# Patient Record
Sex: Female | Born: 1960 | Race: White | Hispanic: No | Marital: Married | Smoking: Never smoker
Health system: Southern US, Community
[De-identification: ages and names within clinical notes are randomized; demographics above are authoritative.]

## PROBLEM LIST (undated history)

## (undated) DIAGNOSIS — Z973 Presence of spectacles and contact lenses: Secondary | ICD-10-CM

## (undated) DIAGNOSIS — I10 Essential (primary) hypertension: Secondary | ICD-10-CM

## (undated) DIAGNOSIS — E785 Hyperlipidemia, unspecified: Secondary | ICD-10-CM

## (undated) DIAGNOSIS — G43909 Migraine, unspecified, not intractable, without status migrainosus: Secondary | ICD-10-CM

## (undated) DIAGNOSIS — Z9889 Other specified postprocedural states: Secondary | ICD-10-CM

## (undated) DIAGNOSIS — K219 Gastro-esophageal reflux disease without esophagitis: Secondary | ICD-10-CM

## (undated) DIAGNOSIS — Z8489 Family history of other specified conditions: Secondary | ICD-10-CM

## (undated) DIAGNOSIS — R112 Nausea with vomiting, unspecified: Secondary | ICD-10-CM

## (undated) DIAGNOSIS — R3915 Urgency of urination: Secondary | ICD-10-CM

## (undated) DIAGNOSIS — N200 Calculus of kidney: Secondary | ICD-10-CM

## (undated) DIAGNOSIS — I444 Left anterior fascicular block: Secondary | ICD-10-CM

## (undated) DIAGNOSIS — T83122A Displacement of urinary stent, initial encounter: Secondary | ICD-10-CM

## (undated) DIAGNOSIS — Z86018 Personal history of other benign neoplasm: Secondary | ICD-10-CM

## (undated) DIAGNOSIS — Z87442 Personal history of urinary calculi: Secondary | ICD-10-CM

## (undated) DIAGNOSIS — K449 Diaphragmatic hernia without obstruction or gangrene: Secondary | ICD-10-CM

## (undated) DIAGNOSIS — G629 Polyneuropathy, unspecified: Secondary | ICD-10-CM

## (undated) HISTORY — PX: TONSILLECTOMY AND ADENOIDECTOMY: SUR1326

---

## 1982-11-15 HISTORY — PX: TOTAL ABDOMINAL HYSTERECTOMY W/ BILATERAL SALPINGOOPHORECTOMY: SHX83

## 1984-11-15 HISTORY — PX: PARTIAL NEPHRECTOMY: SHX414

## 1990-11-15 HISTORY — PX: REDUCTION MAMMAPLASTY: SUR839

## 1999-03-26 ENCOUNTER — Other Ambulatory Visit: Admission: RE | Admit: 1999-03-26 | Discharge: 1999-03-26 | Payer: Self-pay | Admitting: Obstetrics and Gynecology

## 1999-07-29 ENCOUNTER — Encounter: Payer: Self-pay | Admitting: Urology

## 1999-07-29 ENCOUNTER — Ambulatory Visit (HOSPITAL_COMMUNITY): Admission: AD | Admit: 1999-07-29 | Discharge: 1999-07-29 | Payer: Self-pay | Admitting: Urology

## 2000-06-08 ENCOUNTER — Emergency Department (HOSPITAL_COMMUNITY): Admission: EM | Admit: 2000-06-08 | Discharge: 2000-06-08 | Payer: Self-pay | Admitting: Internal Medicine

## 2003-10-23 ENCOUNTER — Ambulatory Visit (HOSPITAL_COMMUNITY): Admission: RE | Admit: 2003-10-23 | Discharge: 2003-10-23 | Payer: Self-pay | Admitting: Obstetrics and Gynecology

## 2006-01-20 ENCOUNTER — Ambulatory Visit: Payer: Self-pay | Admitting: Internal Medicine

## 2006-03-09 ENCOUNTER — Ambulatory Visit: Payer: Self-pay | Admitting: Neurology

## 2006-05-12 ENCOUNTER — Encounter: Admission: RE | Admit: 2006-05-12 | Discharge: 2006-05-12 | Payer: Self-pay | Admitting: Neurosurgery

## 2006-05-31 ENCOUNTER — Encounter: Admission: RE | Admit: 2006-05-31 | Discharge: 2006-05-31 | Payer: Self-pay | Admitting: Neurosurgery

## 2007-04-03 HISTORY — PX: OTHER SURGICAL HISTORY: SHX169

## 2007-04-04 ENCOUNTER — Inpatient Hospital Stay (HOSPITAL_COMMUNITY): Admission: RE | Admit: 2007-04-04 | Discharge: 2007-04-05 | Payer: Self-pay | Admitting: Obstetrics and Gynecology

## 2007-07-28 ENCOUNTER — Emergency Department (HOSPITAL_COMMUNITY): Admission: EM | Admit: 2007-07-28 | Discharge: 2007-07-28 | Payer: Self-pay | Admitting: Emergency Medicine

## 2008-02-04 ENCOUNTER — Encounter: Admission: RE | Admit: 2008-02-04 | Discharge: 2008-02-04 | Payer: Self-pay | Admitting: Neurosurgery

## 2008-03-19 ENCOUNTER — Encounter: Admission: RE | Admit: 2008-03-19 | Discharge: 2008-03-19 | Payer: Self-pay | Admitting: Neurosurgery

## 2008-04-02 ENCOUNTER — Emergency Department (HOSPITAL_COMMUNITY): Admission: EM | Admit: 2008-04-02 | Discharge: 2008-04-02 | Payer: Self-pay | Admitting: Emergency Medicine

## 2008-05-13 ENCOUNTER — Inpatient Hospital Stay (HOSPITAL_COMMUNITY): Admission: RE | Admit: 2008-05-13 | Discharge: 2008-05-15 | Payer: Self-pay | Admitting: Neurosurgery

## 2008-05-13 HISTORY — PX: ANTERIOR CERVICAL DECOMP/DISCECTOMY FUSION: SHX1161

## 2008-05-18 ENCOUNTER — Emergency Department (HOSPITAL_COMMUNITY): Admission: EM | Admit: 2008-05-18 | Discharge: 2008-05-18 | Payer: Self-pay | Admitting: Emergency Medicine

## 2009-03-12 ENCOUNTER — Ambulatory Visit (HOSPITAL_COMMUNITY): Admission: RE | Admit: 2009-03-12 | Discharge: 2009-03-12 | Payer: Self-pay | Admitting: Family Medicine

## 2009-03-24 ENCOUNTER — Encounter: Admission: RE | Admit: 2009-03-24 | Discharge: 2009-03-24 | Payer: Self-pay | Admitting: Surgery

## 2009-04-16 ENCOUNTER — Encounter: Admission: RE | Admit: 2009-04-16 | Discharge: 2009-04-16 | Payer: Self-pay | Admitting: Neurosurgery

## 2009-05-06 ENCOUNTER — Ambulatory Visit (HOSPITAL_COMMUNITY): Admission: RE | Admit: 2009-05-06 | Discharge: 2009-05-06 | Payer: Self-pay | Admitting: Neurosurgery

## 2009-09-19 ENCOUNTER — Encounter: Admission: RE | Admit: 2009-09-19 | Discharge: 2009-09-19 | Payer: Self-pay | Admitting: Neurosurgery

## 2009-10-24 ENCOUNTER — Inpatient Hospital Stay (HOSPITAL_COMMUNITY): Admission: RE | Admit: 2009-10-24 | Discharge: 2009-10-25 | Payer: Self-pay | Admitting: Neurosurgery

## 2009-10-24 HISTORY — PX: POSTERIOR FUSION CERVICAL SPINE: SUR628

## 2009-10-27 ENCOUNTER — Inpatient Hospital Stay (HOSPITAL_COMMUNITY): Admission: AD | Admit: 2009-10-27 | Discharge: 2009-10-29 | Payer: Self-pay | Admitting: Neurosurgery

## 2009-11-03 ENCOUNTER — Inpatient Hospital Stay (HOSPITAL_COMMUNITY): Admission: EM | Admit: 2009-11-03 | Discharge: 2009-11-05 | Payer: Self-pay | Admitting: Emergency Medicine

## 2010-01-01 ENCOUNTER — Encounter: Admission: RE | Admit: 2010-01-01 | Discharge: 2010-01-01 | Payer: Self-pay | Admitting: Neurosurgery

## 2010-02-13 IMAGING — US US ABDOMEN COMPLETE
1 series · 14 of 25 positions shown · non-contrast
Comparison: CT abdomen pelvis 03/12/2009 and ultrasound abdomen
07/28/2007

CLINICAL DATA: Right upper quadrant pain.

COMPLETE ABDOMINAL ULTRASOUND

[Series 1: us abdomen complete · 0.37mm/px · 14 of 70 slices shown]
[im 1/70]
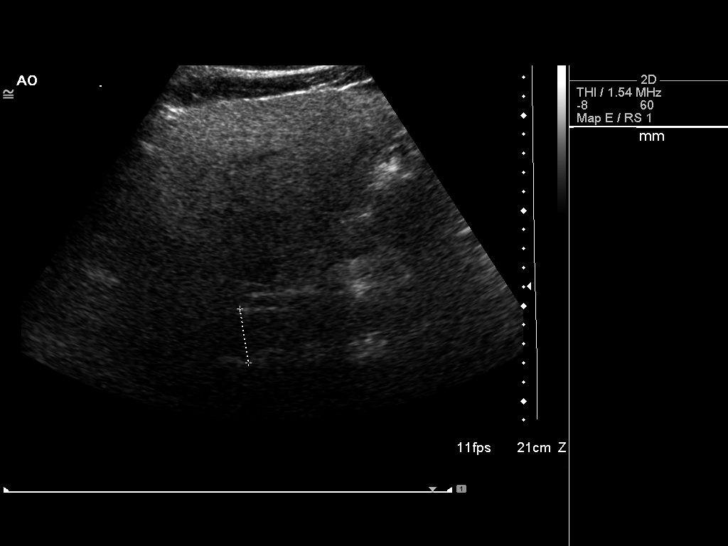
[im 6/70]
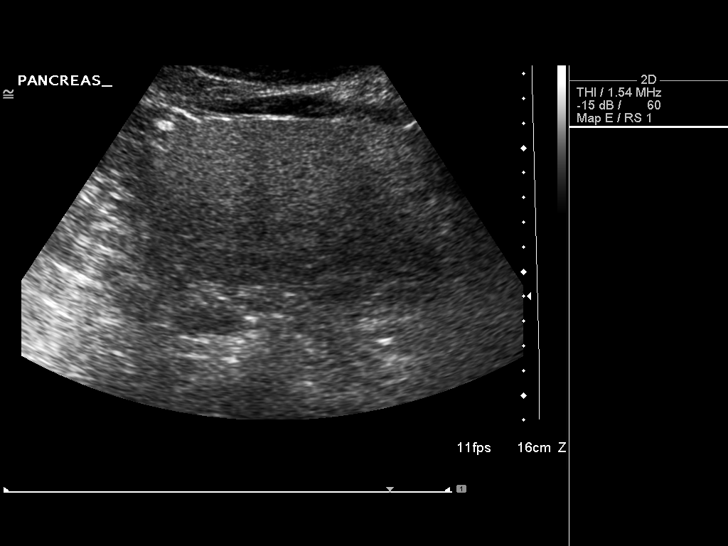
[im 12/70]
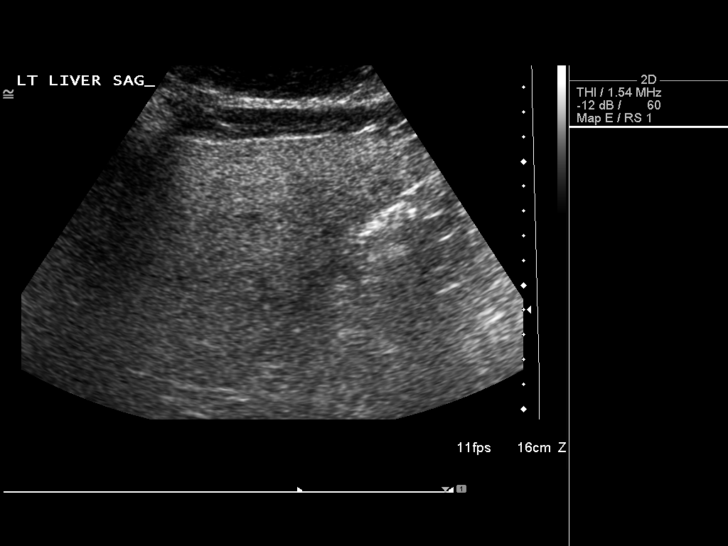
[im 18/70]
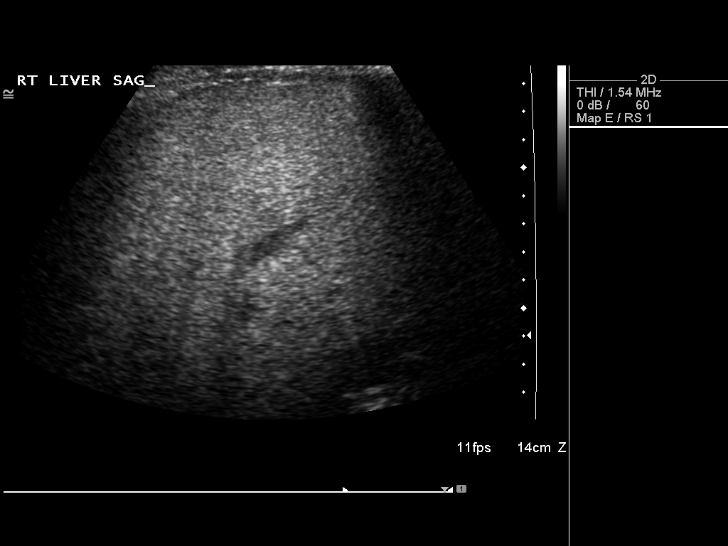
[im 24/70]
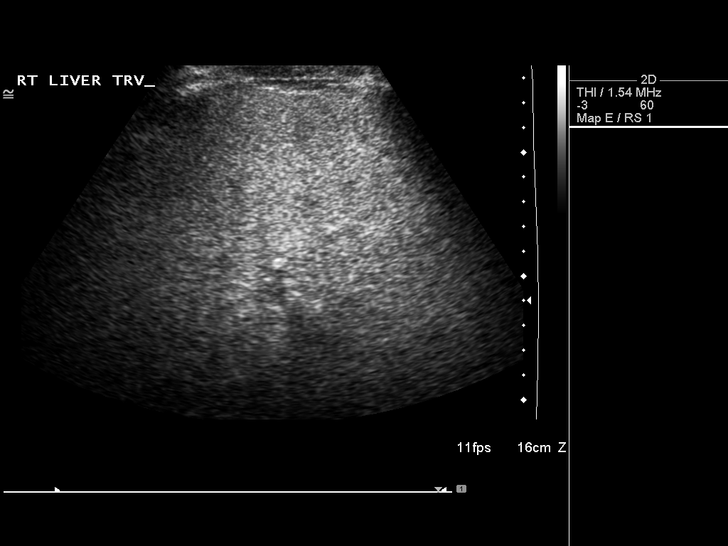
[im 26/70]
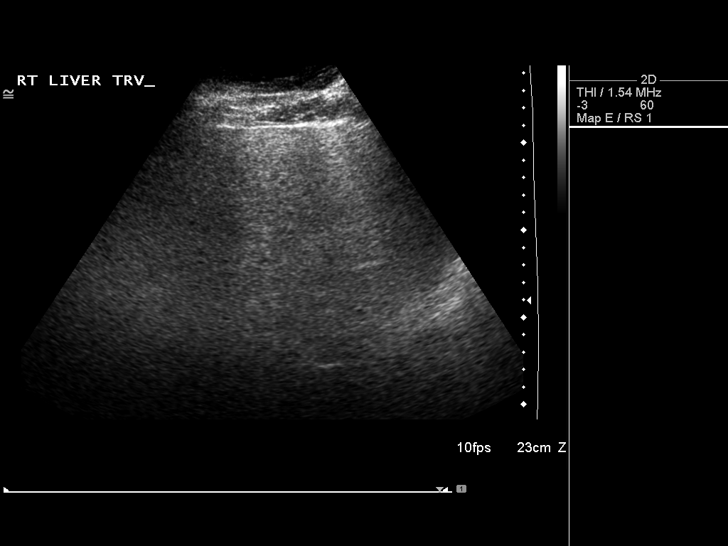
[im 32/70]
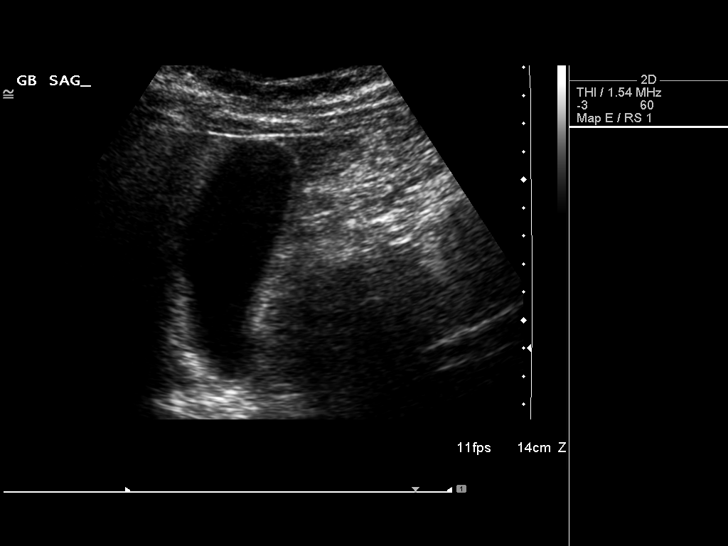
[im 38/70]
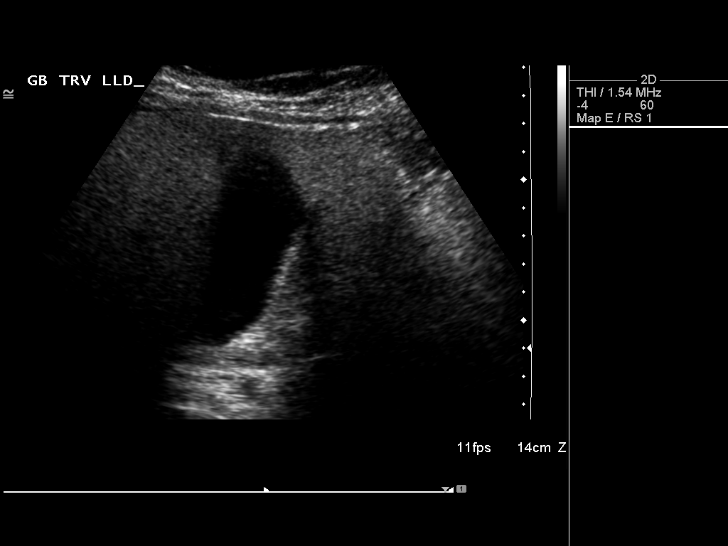
[im 44/70]
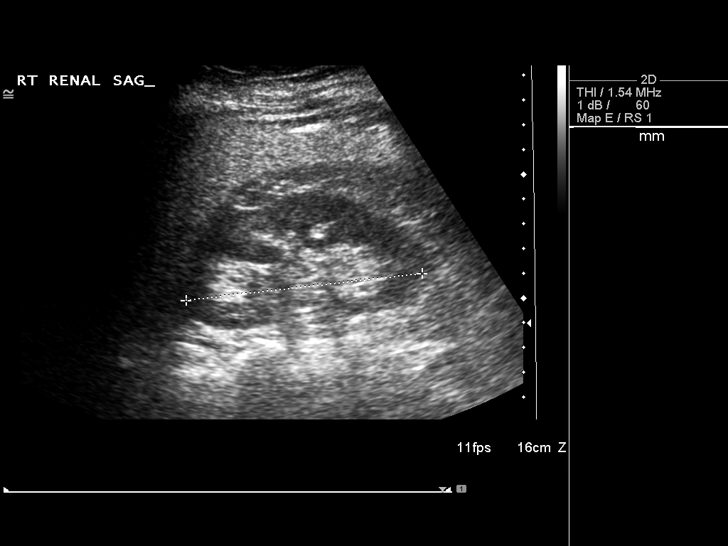
[im 47/70]
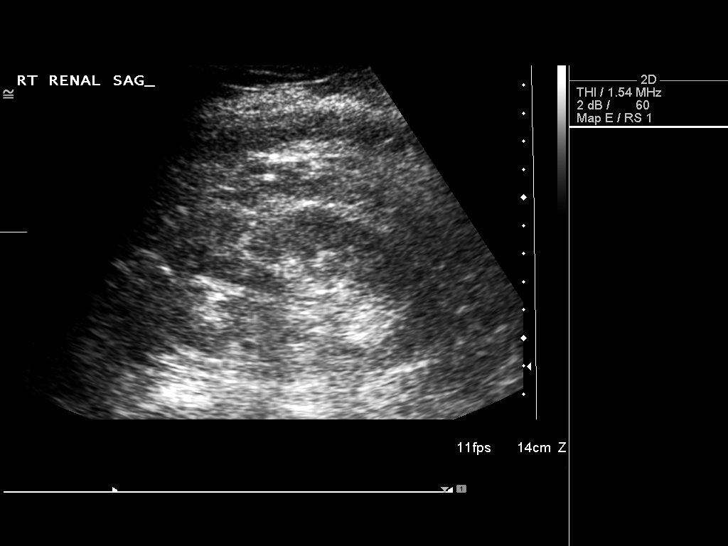
[im 52/70]
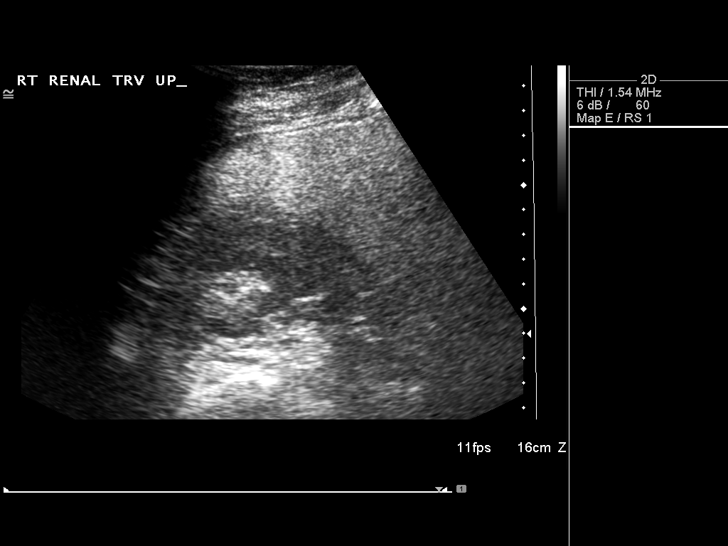
[im 58/70]
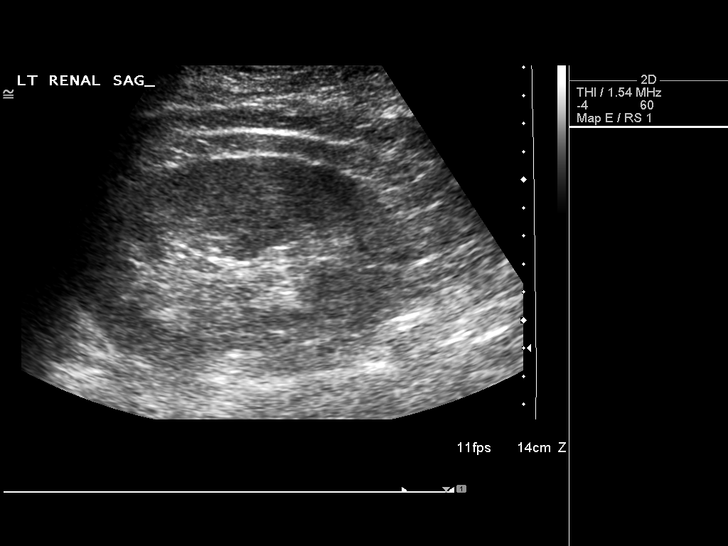
[im 64/70]
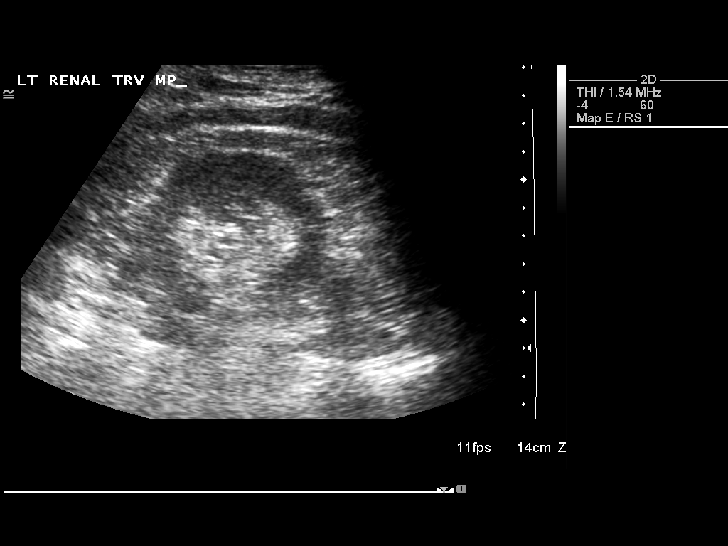
[im 70/70]
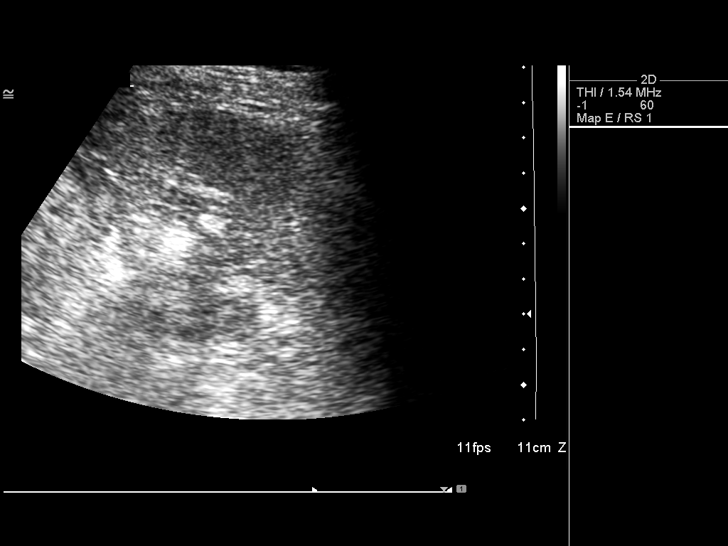

[14 of 25 positions shown; findings below may reference images not displayed]

FINDINGS: Gallbladder:  Negative.

Common bile duct:  3 mm, within normal limits.

Liver:  Measures 19.4 cm and is diffusely increased in
echogenicity.

IVC:  Visualized.

Pancreas:  Negative.

Spleen:  6.2 cm, negative.

Right Kidney:  10.5 cm.  Partial resection of the upper pole is
better appreciated on cross-sectional imaging.

Left Kidney:  12.3 cm, negative.

Abdominal aorta:  Measures up to 2.9 cm in diameter.

Other Findings:  None.
IMPRESSION: Fatty enlarged liver.

## 2010-02-18 ENCOUNTER — Encounter: Admission: RE | Admit: 2010-02-18 | Discharge: 2010-02-18 | Payer: Self-pay | Admitting: Neurosurgery

## 2010-03-19 ENCOUNTER — Ambulatory Visit (HOSPITAL_COMMUNITY): Admission: RE | Admit: 2010-03-19 | Discharge: 2010-03-19 | Payer: Self-pay | Admitting: Family Medicine

## 2010-10-12 ENCOUNTER — Encounter: Admission: RE | Admit: 2010-10-12 | Discharge: 2010-10-12 | Payer: Self-pay | Admitting: Gastroenterology

## 2010-12-06 ENCOUNTER — Encounter: Payer: Self-pay | Admitting: Family Medicine

## 2010-12-06 ENCOUNTER — Encounter: Payer: Self-pay | Admitting: Obstetrics and Gynecology

## 2010-12-07 ENCOUNTER — Encounter: Payer: Self-pay | Admitting: Surgery

## 2010-12-07 ENCOUNTER — Encounter: Payer: Self-pay | Admitting: Emergency Medicine

## 2011-02-15 LAB — CBC
HCT: 40.2 % (ref 36.0–46.0)
Platelets: 356 10*3/uL (ref 150–400)
RDW: 13.2 % (ref 11.5–15.5)
WBC: 14.3 10*3/uL — ABNORMAL HIGH (ref 4.0–10.5)

## 2011-02-15 LAB — BODY FLUID CELL COUNT WITH DIFFERENTIAL
Eos, Fluid: 0 %
Lymphs, Fluid: 5 %
Neutrophil Count, Fluid: 92 % — ABNORMAL HIGH (ref 0–25)
Other Cells, Fluid: 0 %
Total Nucleated Cell Count, Fluid: 625 cu mm (ref 0–1000)

## 2011-02-15 LAB — BASIC METABOLIC PANEL
CO2: 22 mEq/L (ref 19–32)
Creatinine, Ser: 0.42 mg/dL (ref 0.4–1.2)
GFR calc non Af Amer: >60 mL/min (ref >60)
Glucose, Bld: 117 mg/dL — ABNORMAL HIGH (ref 70–99)

## 2011-02-15 LAB — DIFFERENTIAL
Basophils Absolute: 0.2 10*3/uL — ABNORMAL HIGH (ref 0.0–0.1)
Eosinophils Relative: 3 % (ref 0–5)
Lymphocytes Relative: 31 % (ref 12–46)
Lymphs Abs: 4.4 10*3/uL — ABNORMAL HIGH (ref 0.7–4.0)
Neutro Abs: 8.4 10*3/uL — ABNORMAL HIGH (ref 1.7–7.7)
Neutrophils Relative %: 59 % (ref 43–77)

## 2011-02-15 LAB — CULTURE, ROUTINE-ABSCESS

## 2011-02-15 LAB — SEDIMENTATION RATE: Sed Rate: 33 mm/hr — ABNORMAL HIGH (ref 0–22)

## 2011-02-15 LAB — C-REACTIVE PROTEIN: CRP: 4.1 mg/dL — ABNORMAL HIGH (ref <0.6)

## 2011-02-16 LAB — CBC
HCT: 41.8 % (ref 36.0–46.0)
MCHC: 34 g/dL (ref 30.0–36.0)
MCV: 93.1 fL (ref 78.0–100.0)
MCV: 94 fL (ref 78.0–100.0)
Platelets: 291 10*3/uL (ref 150–400)
Platelets: 304 10*3/uL (ref 150–400)
RDW: 13 % (ref 11.5–15.5)
RDW: 13.1 % (ref 11.5–15.5)
WBC: 9.2 10*3/uL (ref 4.0–10.5)

## 2011-02-16 LAB — DIFFERENTIAL
Basophils Absolute: 0.1 10*3/uL (ref 0.0–0.1)
Eosinophils Absolute: 0.1 10*3/uL (ref 0.0–0.7)
Eosinophils Relative: 1 % (ref 0–5)
Lymphocytes Relative: 38 % (ref 12–46)
Lymphs Abs: 3.5 10*3/uL (ref 0.7–4.0)
Neutrophils Relative %: 54 % (ref 43–77)

## 2011-02-16 LAB — BASIC METABOLIC PANEL
BUN: 14 mg/dL (ref 6–23)
BUN: 18 mg/dL (ref 6–23)
CO2: 27 mEq/L (ref 19–32)
Calcium: 8.8 mg/dL (ref 8.4–10.5)
Chloride: 97 mEq/L (ref 96–112)
Creatinine, Ser: 0.6 mg/dL (ref 0.4–1.2)
Creatinine, Ser: 0.62 mg/dL (ref 0.4–1.2)
GFR calc non Af Amer: >60 mL/min (ref >60)
Glucose, Bld: 91 mg/dL (ref 70–99)
Glucose, Bld: 95 mg/dL (ref 70–99)
Potassium: 3.8 mEq/L (ref 3.5–5.1)

## 2011-02-16 LAB — HEPATIC FUNCTION PANEL
Albumin: 4 g/dL (ref 3.5–5.2)
Indirect Bilirubin: 0.6 mg/dL (ref 0.3–0.9)
Total Protein: 7.4 g/dL (ref 6.0–8.3)

## 2011-02-16 LAB — APTT: aPTT: 30 seconds (ref 24–37)

## 2011-02-16 LAB — PROTIME-INR: Prothrombin Time: 13 seconds (ref 11.6–15.2)

## 2011-02-16 LAB — TYPE AND SCREEN
ABO/RH(D): O POS
Antibody Screen: NEGATIVE

## 2011-02-22 LAB — COMPREHENSIVE METABOLIC PANEL
ALT: 70 U/L — ABNORMAL HIGH (ref 0–35)
Alkaline Phosphatase: 64 U/L (ref 39–117)
BUN: 18 mg/dL (ref 6–23)
CO2: 24 mEq/L (ref 19–32)
Calcium: 10.3 mg/dL (ref 8.4–10.5)
GFR calc non Af Amer: >60 mL/min (ref >60)
Glucose, Bld: 94 mg/dL (ref 70–99)
Potassium: 4.2 mEq/L (ref 3.5–5.1)
Sodium: 140 mEq/L (ref 135–145)

## 2011-02-22 LAB — URINALYSIS, ROUTINE W REFLEX MICROSCOPIC
Glucose, UA: NEGATIVE mg/dL
Ketones, ur: NEGATIVE mg/dL
Leukocytes, UA: NEGATIVE
Protein, ur: NEGATIVE mg/dL
Urobilinogen, UA: 0.2 mg/dL (ref 0.0–1.0)

## 2011-02-22 LAB — URINE MICROSCOPIC-ADD ON

## 2011-02-22 LAB — BILIRUBIN, DIRECT: Bilirubin, Direct: 0.2 mg/dL (ref 0.0–0.3)

## 2011-02-22 LAB — CBC
HCT: 43.3 % (ref 36.0–46.0)
Hemoglobin: 14.1 g/dL (ref 12.0–15.0)
MCHC: 32.7 g/dL (ref 30.0–36.0)
RBC: 4.62 MIL/uL (ref 3.87–5.11)

## 2011-03-30 NOTE — Op Note (Signed)
NAMEADRIENNE, Amanda Willis NO.:  [id number]   MEDICAL RECORD NO.:  [id number]          PATIENT TYPE:  INP   LOCATION:  3023                         FACILITY:  [facility]   PHYSICIAN:  Payton Doughty, M.D.      DATE OF BIRTH:  1961-06-09   DATE OF PROCEDURE:  05/13/2008  DATE OF DISCHARGE:                               OPERATIVE REPORT   PREOPERATIVE DIAGNOSIS:  Spondylosis, C5-C6 and C6-C7.   POSTOPERATIVE DIAGNOSIS:  Spondylosis, C5-C6 and C6-C7.   OPERATIVE PROCEDURE:  C5-C6 and C6-C7 anterior cervical decompression  and fusion with Reflex hybrid plate.   SURGEON:  Payton Doughty, M.D.   ANESTHESIA:  General endotracheal.   PREPARATION:  Prepped and draped with alcohol wipe.   COMPLICATIONS:  None.   NURSE ASSISTANTS:  [place]   DOCTOR ASSISTANT:  Stefani Dama, M.D.   BODY OF TEXT:  A 50 year old girl with spondylosis at C5-C6 and C6-C7,  taken to the operating room, smoothly anesthetized and intubated, and  placed supine on the operating table with the neck extended in the  holder head traction.  Following shave, prep, and drape in the usual  sterile fashion, skin was incised from the midline medial border of the  sternocleidomastoid muscle on the left side at about the level of the  carotid tubercle.  The platysma was identified, elevated, divided, and  undermined.  Sternocleidomastoid was identified, and middle dissection  revealed a carotid artery, retracted laterally to the left, trachea and  esophagus were retracted laterally the right exposing the bones in the  anterior cervical spine.   The hand-held retractor was used to displace the trachea and esophagus  throughout the case.  Markers were placed.  Intraoperative x-ray  obtained and confirmed correctness of the level.  Dissection was carried  out at C5-C6 and C6-C7.  Under gross observation.  The operating  microscope was then brought in.  We used microdissection technique to  remove the remaining  disc, divided the posterior longitudinal ligament,  and dissected the neural foramina bilaterally.  At C5-6, there was a  fairly large segment of disk extruded through the posterior longitudinal  ligament.  At C6-C7, there was mostly spondylytic disease with  biforaminal narrowing.  Following complete decompression of both C6 and  C7 ridge bilaterally.  An 8-mm bone grafts were fashioned with patellar  allograft and tapped into place.   A 34-mm Reflex hybrid plate was then placed with 12-mm screws, two in  C5, two in C6, and two in C7. Intraoperative x-ray showed good placement  of the bone graft, plates, and screws.  Wound was irrigated.  Hemostasis assured.  A 10-mm Jackson-Pratt drain was placed in the  prevertebral space and exited by a separate incision.  Successive layers  of 3-0 Vicryl and 4-0 Vicryl were used to close.  Betadine, Telfa,  Benzoin, and Steri-Strips were placed; and made occlusive with OpSite.  The patient returned to recovery room in good condition.           ______________________________  Payton Doughty, M.D.  MWR/MEDQ  D:  05/13/2008  T:  05/14/2008  Job:  [id number]

## 2011-03-30 NOTE — H&P (Signed)
Amanda Willis, TULEY NO.:  [id number]   MEDICAL RECORD NO.:  [id number]          PATIENT TYPE:  INP   LOCATION:  3023                         FACILITY:  [facility]   PHYSICIAN:  Payton Doughty, M.D.      DATE OF BIRTH:  [birth year]   DATE OF ADMISSION:  05/13/2008  DATE OF DISCHARGE:                              HISTORY & PHYSICAL   ADMITTING DIAGNOSIS:  Spondylosis C5-6, C6-7.   BODY TEXT:  This is a 50 year old right-handed white girl with a long  history of cervical spine pain and has also developed pain in left upper  extremity.  MR shows disk at C5-6, spondylosis at C6-7.  The original  plan was to have hybridized constrict, which she had cervical disk  replacement in May 2006, and a fusion at C6-7; however, her insurance  company would not approve it despite the fact that every other insurance  company in the [place] is approving it and she is therefore  admitted for C5-6, C6-7 anterior decompression and fusion.   MEDICAL HISTORY:  Remarkable for gastroesophageal reflux and  hypertension.   MEDICATIONS:  Include Toprol, hydrochlorothiazide, Estrace, Prilosec,  Claritin, aspirin, vitamin B, Percocet, Ambien, Flonase, Citracal, and  Flex seed.   ALLERGIES:  She is allergic to [facility] and CODEINE.   SURGICAL HISTORY:  Hysterectomy, right nephrectomy for polycystic kidney  disease, and exploratory laparotomy.   FAMILY HISTORY:  Mother is 75 in good health. Father is 54 with  hypercholesterolemia.   SOCIAL HISTORY:  She does not smoke, is a light social drinker and is a  Investment banker, corporate.   REVIEW OF SYSTEMS:  Remarkable for neck and left arm pain as well as  some anxiety and depression.  HEENT:  Normal limits.  She has reasonable range of motion in her neck.  CHEST:  Clear.  CARDIAC EXAM:  Regular rate and rhythm.  ABDOMEN:  Nontender.  No hepatosplenomegaly.  EXTREMITIES:  No clubbing or cyanosis.  GU:  Exam is deferred.  Peripheral pulses are good.  NEUROLOGICALLY:  She is awake, alert, and oriented.  Her cranial nerves  are intact.  Motor exam shows 5/5 strength throughout the right upper  extremity and left upper extremity.  Biceps is about 4/5.  Left triceps  is 5-/5.  Sensory dysesthesias described in the left C6 and C7  distribution.  Reflexes are 1 throughout the upper extremities.  Mikey Bussing  is negative.  Lower extremities are not myelopathic.   MR demonstrates spondylitic disease at C5-6 and C6-7, more disk at C5-6,  and more spondylosis C6-7.   CLINICAL IMPRESSION:  Left C6-7 radiculopathy secondary to spondylitic  disease at C5-6, C6-7.   PLAN:  As noted above was originally for a hybridized constrict,  however, was vetoed by [employer] and she simply cannot wait  because of pain in her neck and arm.  She was therefore admitted for  anterior decompression and fusion at C5-6 and C6-7.  The risks and  benefits of this approach have been discussed with her and she wished to  proceed.  ______________________________  Payton Doughty, M.D.     MWR/MEDQ  D:  05/13/2008  T:  05/14/2008  Job:  [id number]

## 2011-03-30 NOTE — Consult Note (Signed)
Amanda Willis, Amanda Willis NO.:  [id number]   MEDICAL RECORD NO.:  [id number]          PATIENT TYPE:  EMS   LOCATION:  MINO                         FACILITY:  [facility]   PHYSICIAN:  Cristi Loron, M.D.DATE OF BIRTH:  1961/11/11   DATE OF CONSULTATION:  DATE OF DISCHARGE:                                 CONSULTATION   CHIEF COMPLIANT:  Neck pain and arm pain.   HISTORY OF PRESENT ILLNESS:  The patient is a 50 year old white female,  a patient of Dr. Loraine Leriche Roy's.  He has been following her for years for  neck and arm pain.  More recently, he worked her up with a cervical MI  in March 2009, which demonstrates some spondylosis at C5-C6 and  herniated disk at C6-C7.  He discussed surgery with her, planning to  have it planned in about 2 weeks; they are awaiting insurance approval.   More recently, in the last few days, the patient has noticed some neck  pain with numbness and tingling in the C8 distribution on the right, C6  distribution on the left.  The patient was concerned and called the  office and subsequently presented to the emergency department.   Presently, the patient with her husband.  She complaints as above.  She  has noticed some weakness on her right hand, which has not been  progressive.  She has not taken any falls.  There was no incontinence.  The patient has been treated with previous medications including Vicodin  and Percocet p.r.n. for pain.  She currently has not taken any  antiinflammatories nor muscle relaxants.   PAST MEDICAL HISTORY:  Positive for hypertension, gastroesophageal  reflux disease, etc.   MEDICATIONS:  Present medications are estradiol, hydrochlorothiazide,  Toprol-XL, Nexium, aspirin, and Percocet.   ALLERGIES:  No known drug allergies.   PHYSICAL EXAM:  GENERAL:  A pleasant, healthy appearing 50 year old  white female who complains of neck pain.  HEENT:  Normocephalic, atraumatic.  Pupils equal, round, and reactive to  light.  NECK:  Decreased cervical range of motion.  No obvious deformities.  NEUROLOGIC:  She is alert and oriented x3.  EXTREMITIES:  The patient's motor strength is 5/5 about deltoid, biceps,  triceps, hand grip, quadriceps, gastrocnemius, extensor hallucis longus,  although she does give-way slightly in her upper extremities.  Deep  tendon reflexes are 1-2/4 about biceps, triceps; 2+ in her bilateral  quadriceps, hamstrings, gastrocnemius.  There is no ankle clonus.  Sensory function demonstrates decreased light touch sensation at the  left C6 distribution and the right C8 distribution.   IMAGING STUDIES:  I have reviewed the patient's cervical MRI performed  today at [facility] and compared it with her prior study of  February 04, 2008.  There was essentially no change, i.e., she has  spondylosis and moderate central stenosis at C5-C6 with bilateral neural  foraminal stenosis.  She has got a small-to-moderate degenerative  changes in C6-C7.  No evidence of cord edema, etc.  No evidence of  herniated disk at C7-T1.   ASSESSMENT AND PLAN:  Neck and  arm pain were discussed with patient and  her husband.  Admitted per the patient's request.  I have told her that  there is really no change in her MRI scan, and we can only continue with  medical management and proceed with Percocet 10/325 mg, #50, 1 p.o. q.4  h. p.r.n. for pain as well as Valium 5 mg, #50, 1 p.o. q.6 h. p.r.n.  muscle spasms, and was started on Medrol Dosepak, dispensed #1, take  p.o. as directed, and I have recommended she go home and take it easy  and follow up with Dr. Channing Mutters next week for further discussion regarding  the timing of her surgery, and she is to call if she has any problem in  the interim.  I have answered all their questions.      Cristi Loron, M.D.  Electronically Signed     JDJ/MEDQ  D:  04/02/2008  T:  04/03/2008  Job:  [id number]

## 2011-03-30 NOTE — Consult Note (Signed)
[name], LEI NO.:  [id number]   MEDICAL RECORD NO.:  [id number]          PATIENT TYPE:  EMS   LOCATION:  MAJO                         FACILITY:  [facility]   PHYSICIAN:  Coletta Memos, M.D.     DATE OF BIRTH:  06/06/1961   DATE OF CONSULTATION:  05/18/2008  DATE OF DISCHARGE:  05/18/2008                                 CONSULTATION   REASON FOR CONSULTATION:  Difficulty breathing, difficulty swallowing.   Amanda Willis is a patient who underwent a cervical fusion done by Dr. Channing Mutters  this past Monday.  She was doing relatively well, but approximately 2-3  days ago started having problems she says swallowing.  She also felt  like she was choking whenever she will lay back.  She called this  morning stating that she was coming to the emergency room secondary to  these feelings.  She arrived into ER and had a CT of the cervical spine  performed.  What it showed is as she has prevertebral swelling 15 mm at  225 mm at C6.  She is able to swallow.  She has had no problems handling  her own secretions.  She says she has not eaten since yesterday evening  due to the problems with swallowing.  She can drink water, but says that  just feels as it is getting stuck in her throat.   Past medical history is significant for gastroesophageal reflux disease,  hypertension, and multiple sclerosis.   She does not use drugs.  She does not drink.  She does not smoke.   MEDICATIONS:  Estradiol, hydrochlorothiazide, Toprol-XL, Nexium,  aspirin, and Percocet.   Blood pressure 151/96, pulse 91, respiratory rate 20.  She has a 97%  oxygen saturation on air.  She is using no accessory muscle for  breathing and appears to be comfortable.  Laboratory values are all  normal.   She has full strength in the upper extremities.  Normal muscle tone  bulk, coordination.  Speaking voice is normal.  She is not hoarse.   CT shows no obstruction of the airway.  The hardware and bone plugs are  both  in good position.  She underwent her surgery on May 13, 2008.  She  felt that she had had a hematoma.  She says that she felt okay to go  home when she was told that she did not have a hematoma.   On external inspection, she does not have any significant swelling  overlying the incision.  She does have erythema surrounding the incision  and the drain exit site.  Otherwise, her incision looks good.   I am going to have Amanda Willis just eat something here in the emergency  room.  I am going to give her 10 mg of Decadron also along with 1 mg of  Dilaudid.  She has a reaction to morphine.   I also called in prescriptions to the [facility] for Lorcet  elixir and for Decadron to be taken approximately 4 days 2 mg q.6 h.   I explained this to she and  her husband.  I also explained to her that  if she still had the sensations regardless of whether or not the film is  showing obstruction, if she felt uncomfortable, I was more than a happy  to admit her.  She says that she is okay and now that she knows she does  not have a hematoma.           ______________________________  Coletta Memos, M.D.     KC/MEDQ  D:  05/18/2008  T:  05/19/2008  Job:  [id number]

## 2011-04-02 NOTE — Op Note (Signed)
[name], Amanda Willis                 ACCOUNT NO.:  [id number]   MEDICAL RECORD NO.:  [id number]          PATIENT TYPE:  OIB   LOCATION:  9318                          [facility]ACILITY:  [facility]   PHYSICIAN:  Timothy E. Earlene Plater, M.D. DATE OF BIRTH:  11/12/1961   DATE OF PROCEDURE:  04/03/2007  DATE OF DISCHARGE:                               OPERATIVE REPORT   PREOPERATIVE DIAGNOSIS:  External hemorrhoids.   POSTOPERATIVE DIAGNOSIS:  External hemorrhoids.   PROCEDURE:  External hemorrhoidectomy.   SURGEON:  Kendrick Ranch.   ANESTHESIA:  General.   Amanda Willis is undergoing surgery by Dr. Marcelle Overlie for rectocele and  posterior repair.  Patient has hemorrhoids that cause itching,  discomfort, protrusion, soilage.  After discussion in the office, she  wishes to have a hemorrhoidectomy which has been carefully discussed.   Patient had been seen and identified, the permit was signed.  She was  under general anesthesia, Dr. Vincente Poli had completed her posterior repair  and the patient was stable.  The perianal area was inspected, reprepped,  draped in the usual fashion.  There was a large hemorrhoid posteriorly,  a smaller external tag anteriorly.  The perianal area was injected round  and about with Marcaine, epinephrine and Wydase and massaged in well.  The large posterior hemorrhoid was excised as a standard ellipse and  closure was accomplished from inside out with #3-0 chromic.  Undermining  of the edges removed the superficial varicosities and the subsequent  skin was closed satisfactorily and flat.  The anterior tag was small, it  was removed in a circular fashion, undermined the edges and then closed  with interrupted #3-0 chromic.  The results were satisfactory, there  were no other major lesions, Gelfoam gauze and a dry sterile dressing  applied.  She tolerated it well and was removed to Recovery Room in good  condition.  She will remain under Dr. Lynnell Dike care.  Follow as an   outpatient.      Timothy E. Earlene Plater, M.D.  Electronically Signed     TED/MEDQ  D:  04/03/2007  T:  04/03/2007  Job:  [id number]

## 2011-04-02 NOTE — Op Note (Signed)
Amanda Willis, Amanda Willis                 ACCOUNT NO.:  [id number]   MEDICAL RECORD NO.:  [id number]          PATIENT TYPE:  AMB   LOCATION:  SDC                           [facility]ACILITY[facility]  [facility]   PHYSICIAN:  Michelle L. Grewal, M.D.DATE OF BIRTH:  1961-01-16   DATE OF PROCEDURE:  04/03/2007  DATE OF DISCHARGE:                               OPERATIVE REPORT   PREOPERATIVE DIAGNOSIS:  Symptomatic rectocele.   POSTOPERATIVE DIAGNOSIS:  Symptomatic rectocele.   PROCEDURE:  Posterior repair.   SURGEON:  Michelle L. Vincente Poli, M.D.   ANESTHESIA:  General anesthesia.   SPECIMENS:  None.   ESTIMATED BLOOD LOSS:  100 mL.   COMPLICATIONS:  None.   DESCRIPTION OF PROCEDURE:  Patient is taken to the operating room after  informed consent was obtained.  She is then prepped and draped in the  usual sterile  fashion.  A Foley catheter was inserted.  Examination  under anesthesia revealed that she had excellent support of the bladder  in the vaginal vault.  She is status post hysterectomy.  She had a grade  III rectocele and she had several external hemorrhoids.  I placed Allis  clamps on the perineum at the 5:00 and 7 o'clock position.  I made a V-  shaped incision with the scalpel.  I made a midline incision all the way  up the posterior vaginal wall using Metzenbaum scissors.  The rectocele  was reduced using sharp and blunt dissection.  We then reapproximated  the rectovaginal fascia in the midline, using a series of interrupteds  using 0 Vicryl suture.  The excessive vaginal epithelium was trimmed.  The vaginal incision was closed using 2-0 Vicryl in a running stitch,  starting distally and moving proximally.  At the end of the procedure  she had excellent correction  of the rectocele, excellent vaginal length as well as depth and width.  A vaginal packing was placed in the vagina.  At this point, I scrubbed  out and Dr. Lorelee New scrubbed in and performed his procedure which  will be dictated by  him.  At that point, all  sponge, lap and instrument  are counts correct x2.      Michelle L. Vincente Poli, M.D.  Electronically Signed     MLG/MEDQ  D:  04/03/2007  T:  04/03/2007  Job:  [id number]   cc:   Sheppard Plumber. Earlene Plater, M.D.  [address] [place]  [place] [address]

## 2011-08-12 LAB — URINALYSIS, ROUTINE W REFLEX MICROSCOPIC
Glucose, UA: NEGATIVE
Nitrite: NEGATIVE
Protein, ur: NEGATIVE
Urobilinogen, UA: 0.2

## 2011-08-12 LAB — APTT: aPTT: 28

## 2011-08-12 LAB — CBC
HCT: 42.7
MCV: 92.3
Platelets: 354
Platelets: 369
RDW: 13
WBC: 11.4 — ABNORMAL HIGH

## 2011-08-12 LAB — COMPREHENSIVE METABOLIC PANEL
Albumin: 4.5
BUN: 21
Calcium: 9.7
Creatinine, Ser: 0.65
Total Protein: 6.9

## 2011-08-12 LAB — DIFFERENTIAL
Basophils Absolute: 0.1
Eosinophils Relative: 1
Lymphocytes Relative: 38
Lymphocytes Relative: 30
Lymphs Abs: 4.7 — ABNORMAL HIGH
Lymphs Abs: 3.4
Neutrophils Relative %: 60

## 2011-08-12 LAB — PROTIME-INR
INR: 1
Prothrombin Time: 12.9

## 2011-08-12 LAB — POCT I-STAT, CHEM 8
BUN: 13
Creatinine, Ser: 0.7
Hemoglobin: 15.6 — ABNORMAL HIGH
Potassium: 3.8
Sodium: 137

## 2011-08-27 LAB — URINALYSIS, ROUTINE W REFLEX MICROSCOPIC
Glucose, UA: NEGATIVE
Leukocytes, UA: NEGATIVE
Specific Gravity, Urine: 1.028
Urobilinogen, UA: 0.2

## 2011-08-27 LAB — CBC
MCHC: 34.2
MCV: 91.2
Platelets: 334
RBC: 4.92

## 2011-08-27 LAB — COMPREHENSIVE METABOLIC PANEL
ALT: 44 — ABNORMAL HIGH
AST: 53 — ABNORMAL HIGH
Albumin: 4
CO2: 25
Calcium: 9.3
Creatinine, Ser: 0.65
GFR calc Af Amer: >60
GFR calc non Af Amer: >60
Sodium: 140
Total Protein: 7.4

## 2011-08-27 LAB — DIFFERENTIAL
Eosinophils Absolute: 0.1
Eosinophils Relative: 1
Lymphocytes Relative: 38
Lymphs Abs: 3.4 — ABNORMAL HIGH
Monocytes Absolute: 0.6
Monocytes Relative: 7

## 2011-08-27 LAB — LIPASE, BLOOD: Lipase: 29

## 2011-08-27 LAB — URINE MICROSCOPIC-ADD ON

## 2012-01-20 ENCOUNTER — Other Ambulatory Visit: Payer: Self-pay | Admitting: Neurosurgery

## 2012-01-20 DIAGNOSIS — M542 Cervicalgia: Secondary | ICD-10-CM

## 2012-01-26 ENCOUNTER — Ambulatory Visit
Admission: RE | Admit: 2012-01-26 | Discharge: 2012-01-26 | Disposition: A | Discharge status: Home / Self Care | Payer: Medicare Other | Source: Ambulatory Visit | Attending: Neurosurgery | Admitting: Neurosurgery

## 2012-01-26 DIAGNOSIS — M542 Cervicalgia: Secondary | ICD-10-CM

## 2012-01-26 MED ORDER — GADOBENATE DIMEGLUMINE 529 MG/ML IV SOLN
20.0000 mL | Freq: Once | INTRAVENOUS | Status: AC | PRN
  Administered 2012-01-26: 20 mL via INTRAVENOUS

## 2012-05-10 ENCOUNTER — Other Ambulatory Visit: Payer: Self-pay | Admitting: Family Medicine

## 2012-05-10 DIAGNOSIS — R1011 Right upper quadrant pain: Secondary | ICD-10-CM

## 2012-05-11 ENCOUNTER — Other Ambulatory Visit: Payer: Medicare Other

## 2012-05-15 ENCOUNTER — Ambulatory Visit
Admission: RE | Admit: 2012-05-15 | Discharge: 2012-05-15 | Disposition: A | Discharge status: Home / Self Care | Payer: Medicare Other | Source: Ambulatory Visit | Attending: Family Medicine | Admitting: Family Medicine

## 2012-05-15 DIAGNOSIS — R1011 Right upper quadrant pain: Secondary | ICD-10-CM

## 2013-01-26 ENCOUNTER — Other Ambulatory Visit: Payer: Self-pay | Admitting: Neurosurgery

## 2013-01-26 DIAGNOSIS — M5126 Other intervertebral disc displacement, lumbar region: Secondary | ICD-10-CM

## 2013-01-31 ENCOUNTER — Ambulatory Visit
Admission: RE | Admit: 2013-01-31 | Discharge: 2013-01-31 | Disposition: A | Discharge status: Home / Self Care | Payer: Medicare Other | Source: Ambulatory Visit | Attending: Neurosurgery | Admitting: Neurosurgery

## 2013-01-31 DIAGNOSIS — M5126 Other intervertebral disc displacement, lumbar region: Secondary | ICD-10-CM

## 2013-02-28 ENCOUNTER — Encounter (HOSPITAL_COMMUNITY): Payer: Self-pay | Admitting: Pharmacist

## 2013-03-08 ENCOUNTER — Encounter (HOSPITAL_COMMUNITY)
Admission: RE | Admit: 2013-03-08 | Discharge: 2013-03-08 | Disposition: A | Discharge status: Home / Self Care | Payer: Medicare Other | Source: Ambulatory Visit | Attending: Obstetrics and Gynecology | Admitting: Obstetrics and Gynecology

## 2013-03-08 ENCOUNTER — Encounter (HOSPITAL_COMMUNITY): Payer: Self-pay

## 2013-03-08 HISTORY — DX: Essential (primary) hypertension: I10

## 2013-03-08 HISTORY — DX: Other specified postprocedural states: Z98.890

## 2013-03-08 HISTORY — DX: Other specified postprocedural states: R11.2

## 2013-03-08 HISTORY — DX: Gastro-esophageal reflux disease without esophagitis: K21.9

## 2013-03-08 LAB — CBC
HCT: 43 % (ref 36.0–46.0)
Hemoglobin: 14.7 g/dL (ref 12.0–15.0)
MCHC: 34.2 g/dL (ref 30.0–36.0)
RBC: 4.74 MIL/uL (ref 3.87–5.11)
WBC: 10.5 10*3/uL (ref 4.0–10.5)

## 2013-03-08 LAB — BASIC METABOLIC PANEL
BUN: 15 mg/dL (ref 6–23)
CO2: 28 mEq/L (ref 19–32)
Chloride: 98 mEq/L (ref 96–112)
Glucose, Bld: 106 mg/dL — ABNORMAL HIGH (ref 70–99)
Potassium: 4.2 mEq/L (ref 3.5–5.1)

## 2013-03-08 NOTE — Patient Instructions (Addendum)
Your procedure is scheduled on:03/15/13  Enter through the Main Entrance at :6am Pick up desk phone and dial [phone] and inform us of your arrival.  Please call [phone] if you have any problems the morning of surgery.  Remember: Do not eat or drink after midnight:WED.   Take these meds the morning of surgery with a sip of water: blood pressure meds, Nexium  DO NOT wear jewelry, eye make-up, lipstick,body lotion, or dark fingernail polish. Do not shave for 48 hours prior to surgery.  If you are to be admitted after surgery, leave suitcase in car until your room has been assigned. Patients discharged on the day of surgery will not be allowed to drive home.

## 2013-03-14 ENCOUNTER — Encounter (HOSPITAL_COMMUNITY): Payer: Self-pay | Admitting: Anesthesiology

## 2013-03-14 NOTE — Anesthesia Preprocedure Evaluation (Addendum)
Anesthesia Evaluation  Patient identified by MRN, date of birth, ID band Patient awake    Reviewed: Allergy & Precautions, H&P , NPO status , Patient's Chart, lab work & pertinent test results, reviewed documented beta blocker date and time   History of Anesthesia Complications (+) PONV and PROLONGED EMERGENCE  Airway Mallampati: II TM Distance: >3 FB Neck ROM: Full    Dental no notable dental hx. (+) Teeth Intact   Pulmonary shortness of breath,  breath sounds clear to auscultation  Pulmonary exam normal       Cardiovascular hypertension, Pt. on medications and Pt. on home beta blockers negative cardio ROS  Rhythm:Regular Rate:Normal     Neuro/Psych  Headaches, negative psych ROS   GI/Hepatic Neg liver ROS, hiatal hernia, GERD-  Medicated and Controlled,  Endo/Other  negative endocrine ROS  Renal/GU negative Renal ROS   Rectocele negative genitourinary   Musculoskeletal negative musculoskeletal ROS (+)   Abdominal (+) + obese,   Peds  Hematology negative hematology ROS (+)   Anesthesia Other Findings   Reproductive/Obstetrics negative OB ROS                         Anesthesia Physical Anesthesia Plan  ASA: II  Anesthesia Plan: General   Post-op Pain Management:    Induction: Intravenous  Airway Management Planned: Oral ETT  Additional Equipment:   Intra-op Plan:   Post-operative Plan: Extubation in OR  Informed Consent: I have reviewed the patients History and Physical, chart, labs and discussed the procedure including the risks, benefits and alternatives for the proposed anesthesia with the patient or authorized representative who has indicated his/her understanding and acceptance.   Dental advisory given  Plan Discussed with: CRNA, Anesthesiologist and Surgeon  Anesthesia Plan Comments:        Anesthesia Quick Evaluation

## 2013-03-14 NOTE — H&P (Signed)
52 year old G 2 P 2 with symptomatic rectocele. She has noted a bulge in vagina x 1 year. +Splinting. Worse with Sitting and BM  Medical History: MS Hiatal hernia Headaches UTI Hypertension  Surgical history: TAH/BSO 1984 Partial left nephrectomy 1985 Posterior Repair 2009 Neck fusion 2010 Neck fusion 2012  Allergic to reglan, morphine  Family history  Bladder canacer Hypertension Osteoporosis UTI Heart Disease Hypothyroidism  Afebrile vss General alert and oriented Lung CTAB Car RRR Abdomen is soft and non tender Pelvic  Grade 1 to 2 high rectocele Good vaginal vault support No cystocele  IMPRESSION: Symptomatic Rectocele  PLAN: Posterior repair Risks reviewed with patient Consent is signed

## 2013-03-15 ENCOUNTER — Encounter (HOSPITAL_COMMUNITY)
Admission: RE | Disposition: A | Discharge status: Home / Self Care | Payer: Self-pay | Source: Ambulatory Visit | Attending: Obstetrics and Gynecology

## 2013-03-15 ENCOUNTER — Encounter (HOSPITAL_COMMUNITY): Payer: Self-pay | Admitting: Anesthesiology

## 2013-03-15 ENCOUNTER — Ambulatory Visit (HOSPITAL_COMMUNITY): Payer: Medicare Other | Admitting: Anesthesiology

## 2013-03-15 ENCOUNTER — Encounter (HOSPITAL_COMMUNITY): Payer: Self-pay | Admitting: *Deleted

## 2013-03-15 ENCOUNTER — Observation Stay (HOSPITAL_COMMUNITY)
Admission: RE | Admit: 2013-03-15 | Discharge: 2013-03-16 | Disposition: A | Discharge status: Home / Self Care | Payer: Medicare Other | Source: Ambulatory Visit | Attending: Obstetrics and Gynecology | Admitting: Obstetrics and Gynecology

## 2013-03-15 DIAGNOSIS — N993 Prolapse of vaginal vault after hysterectomy: Principal | ICD-10-CM | POA: Insufficient documentation

## 2013-03-15 DIAGNOSIS — N816 Rectocele: Secondary | ICD-10-CM

## 2013-03-15 DIAGNOSIS — Z888 Allergy status to other drugs, medicaments and biological substances status: Secondary | ICD-10-CM | POA: Insufficient documentation

## 2013-03-15 DIAGNOSIS — N390 Urinary tract infection, site not specified: Secondary | ICD-10-CM | POA: Insufficient documentation

## 2013-03-15 HISTORY — PX: ANTERIOR AND POSTERIOR REPAIR: SHX5121

## 2013-03-15 LAB — BASIC METABOLIC PANEL
CO2: 26 mEq/L (ref 19–32)
Chloride: 103 mEq/L (ref 96–112)
GFR calc Af Amer: >90 mL/min (ref >90)
Potassium: 3.6 mEq/L (ref 3.5–5.1)

## 2013-03-15 LAB — CBC
HCT: 39.3 % (ref 36.0–46.0)
Hemoglobin: 13.2 g/dL (ref 12.0–15.0)
MCV: 91.2 fL (ref 78.0–100.0)
WBC: 7.6 10*3/uL (ref 4.0–10.5)

## 2013-03-15 SURGERY — ANTERIOR (CYSTOCELE) AND POSTERIOR REPAIR (RECTOCELE)
Anesthesia: General | Site: Vagina | Wound class: Clean Contaminated

## 2013-03-15 MED ORDER — ESTRADIOL 0.1 MG/GM VA CREA
TOPICAL_CREAM | VAGINAL | Status: AC
  Filled 2013-03-15: qty 42.5

## 2013-03-15 MED ORDER — NEOSTIGMINE METHYLSULFATE 1 MG/ML IJ SOLN
INTRAMUSCULAR | Status: DC | PRN
Start: 2013-03-15 — End: 2013-03-15
  Administered 2013-03-15: 2 mg via INTRAVENOUS

## 2013-03-15 MED ORDER — PROPOFOL 10 MG/ML IV EMUL
INTRAVENOUS | Status: AC
  Filled 2013-03-15: qty 20

## 2013-03-15 MED ORDER — MEPERIDINE HCL 25 MG/ML IJ SOLN: 6.2500 mg | INTRAMUSCULAR | Status: DC | PRN

## 2013-03-15 MED ORDER — FENTANYL CITRATE 0.05 MG/ML IJ SOLN
25.0000 ug | INTRAMUSCULAR | Status: DC | PRN
  Administered 2013-03-15: 50 ug via INTRAVENOUS

## 2013-03-15 MED ORDER — FENTANYL CITRATE 0.05 MG/ML IJ SOLN
INTRAMUSCULAR | Status: AC
  Filled 2013-03-15: qty 5

## 2013-03-15 MED ORDER — ROCURONIUM BROMIDE 100 MG/10ML IV SOLN
INTRAVENOUS | Status: DC | PRN
  Administered 2013-03-15: 30 mg via INTRAVENOUS

## 2013-03-15 MED ORDER — MIDAZOLAM HCL 2 MG/2ML IJ SOLN
INTRAMUSCULAR | Status: AC
  Filled 2013-03-15: qty 2

## 2013-03-15 MED ORDER — ROCURONIUM BROMIDE 50 MG/5ML IV SOLN
INTRAVENOUS | Status: AC
  Filled 2013-03-15: qty 1

## 2013-03-15 MED ORDER — LACTATED RINGERS IV SOLN
INTRAVENOUS | Status: DC
  Administered 2013-03-15 (×2): via INTRAVENOUS

## 2013-03-15 MED ORDER — TRAMADOL HCL 50 MG PO TABS
50.0000 mg | ORAL_TABLET | Freq: Four times a day (QID) | ORAL | Status: DC | PRN
  Administered 2013-03-15 – 2013-03-16 (×3): 50 mg via ORAL
  Filled 2013-03-15 (×3): qty 1

## 2013-03-15 MED ORDER — FENTANYL CITRATE 0.05 MG/ML IJ SOLN
INTRAMUSCULAR | Status: AC
  Administered 2013-03-15: 50 ug via INTRAVENOUS
  Filled 2013-03-15: qty 2

## 2013-03-15 MED ORDER — TEMAZEPAM 15 MG PO CAPS
15.0000 mg | ORAL_CAPSULE | Freq: Every evening | ORAL | Status: DC | PRN
  Administered 2013-03-15: 15 mg via ORAL
  Filled 2013-03-15: qty 1

## 2013-03-15 MED ORDER — MENTHOL 3 MG MT LOZG: 1.0000 | LOZENGE | OROMUCOSAL | Status: DC | PRN

## 2013-03-15 MED ORDER — DEXTROSE IN LACTATED RINGERS 5 % IV SOLN
INTRAVENOUS | Status: DC
  Administered 2013-03-15 – 2013-03-16 (×2): via INTRAVENOUS

## 2013-03-15 MED ORDER — NEOSTIGMINE METHYLSULFATE 1 MG/ML IJ SOLN
INTRAMUSCULAR | Status: AC
  Filled 2013-03-15: qty 1

## 2013-03-15 MED ORDER — LACTATED RINGERS IV SOLN
INTRAVENOUS | Status: DC
  Administered 2013-03-15: 20:00:00 via INTRAVENOUS

## 2013-03-15 MED ORDER — CEFAZOLIN SODIUM-DEXTROSE 2-3 GM-% IV SOLR
2.0000 g | INTRAVENOUS | Status: AC
  Administered 2013-03-15: 2 g via INTRAVENOUS

## 2013-03-15 MED ORDER — GLYCOPYRROLATE 0.2 MG/ML IJ SOLN
INTRAMUSCULAR | Status: DC | PRN
  Administered 2013-03-15: 0.4 mg via INTRAVENOUS

## 2013-03-15 MED ORDER — METOPROLOL SUCCINATE ER 50 MG PO TB24
50.0000 mg | ORAL_TABLET | Freq: Every day | ORAL | Status: DC
  Administered 2013-03-16: 50 mg via ORAL
  Filled 2013-03-15 (×2): qty 1

## 2013-03-15 MED ORDER — ESTRADIOL 0.1 MG/GM VA CREA
TOPICAL_CREAM | VAGINAL | Status: DC | PRN
  Administered 2013-03-15: 1 via VAGINAL

## 2013-03-15 MED ORDER — BUPIVACAINE-EPINEPHRINE 0.5% -1:200000 IJ SOLN
INTRAMUSCULAR | Status: DC | PRN
  Administered 2013-03-15: 5 mL

## 2013-03-15 MED ORDER — KETOROLAC TROMETHAMINE 30 MG/ML IJ SOLN
INTRAMUSCULAR | Status: AC
  Filled 2013-03-15: qty 1

## 2013-03-15 MED ORDER — OXYCODONE-ACETAMINOPHEN 5-325 MG PO TABS
1.0000 | ORAL_TABLET | ORAL | Status: DC | PRN
  Administered 2013-03-15 – 2013-03-16 (×6): 2 via ORAL
  Filled 2013-03-15 (×6): qty 2

## 2013-03-15 MED ORDER — ONDANSETRON HCL 4 MG/2ML IJ SOLN
INTRAMUSCULAR | Status: AC
  Filled 2013-03-15: qty 2

## 2013-03-15 MED ORDER — IBUPROFEN 600 MG PO TABS
600.0000 mg | ORAL_TABLET | Freq: Four times a day (QID) | ORAL | Status: DC | PRN
  Administered 2013-03-15 – 2013-03-16 (×3): 600 mg via ORAL
  Filled 2013-03-15 (×3): qty 1

## 2013-03-15 MED ORDER — MIDAZOLAM HCL 5 MG/5ML IJ SOLN
INTRAMUSCULAR | Status: DC | PRN
  Administered 2013-03-15: 2 mg via INTRAVENOUS

## 2013-03-15 MED ORDER — KETOROLAC TROMETHAMINE 30 MG/ML IJ SOLN
INTRAMUSCULAR | Status: DC | PRN
  Administered 2013-03-15: 30 mg via INTRAVENOUS

## 2013-03-15 MED ORDER — CEFAZOLIN SODIUM-DEXTROSE 2-3 GM-% IV SOLR
INTRAVENOUS | Status: AC
  Filled 2013-03-15: qty 50

## 2013-03-15 MED ORDER — FENTANYL CITRATE 0.05 MG/ML IJ SOLN
INTRAMUSCULAR | Status: AC
  Filled 2013-03-15: qty 2

## 2013-03-15 MED ORDER — ONDANSETRON HCL 4 MG/2ML IJ SOLN
INTRAMUSCULAR | Status: DC | PRN
  Administered 2013-03-15: 4 mg via INTRAVENOUS

## 2013-03-15 MED ORDER — HYDROCHLOROTHIAZIDE 50 MG PO TABS
50.0000 mg | ORAL_TABLET | Freq: Every day | ORAL | Status: DC
  Administered 2013-03-16: 50 mg via ORAL
  Filled 2013-03-15 (×2): qty 1

## 2013-03-15 MED ORDER — DEXAMETHASONE SODIUM PHOSPHATE 4 MG/ML IJ SOLN
INTRAMUSCULAR | Status: DC | PRN
  Administered 2013-03-15: 10 mg via INTRAVENOUS

## 2013-03-15 MED ORDER — KETOROLAC TROMETHAMINE 30 MG/ML IJ SOLN: 30.0000 mg | Freq: Once | INTRAMUSCULAR | Status: DC

## 2013-03-15 MED ORDER — PROMETHAZINE HCL 25 MG/ML IJ SOLN: 12.5000 mg | Freq: Once | INTRAMUSCULAR | Status: DC | PRN

## 2013-03-15 MED ORDER — FENTANYL CITRATE 0.05 MG/ML IJ SOLN
INTRAMUSCULAR | Status: DC | PRN
  Administered 2013-03-15 (×2): 50 ug via INTRAVENOUS
  Administered 2013-03-15: 150 ug via INTRAVENOUS
  Administered 2013-03-15: 100 ug via INTRAVENOUS

## 2013-03-15 MED ORDER — ESTRADIOL 1 MG PO TABS
1.0000 mg | ORAL_TABLET | Freq: Every day | ORAL | Status: DC
  Administered 2013-03-16: 1 mg via ORAL
  Filled 2013-03-15 (×2): qty 1

## 2013-03-15 MED ORDER — PROPOFOL 10 MG/ML IV EMUL
INTRAVENOUS | Status: DC | PRN
  Administered 2013-03-15: 250 mg via INTRAVENOUS

## 2013-03-15 MED ORDER — BUPIVACAINE-EPINEPHRINE (PF) 0.5% -1:200000 IJ SOLN
INTRAMUSCULAR | Status: AC
  Filled 2013-03-15: qty 10

## 2013-03-15 MED ORDER — SCOPOLAMINE 1 MG/3DAYS TD PT72
MEDICATED_PATCH | TRANSDERMAL | Status: AC
  Administered 2013-03-15: 1.5 mg via TRANSDERMAL
  Filled 2013-03-15: qty 1

## 2013-03-15 MED ORDER — LIDOCAINE HCL (CARDIAC) 20 MG/ML IV SOLN
INTRAVENOUS | Status: AC
  Filled 2013-03-15: qty 5

## 2013-03-15 MED ORDER — LIDOCAINE HCL (CARDIAC) 20 MG/ML IV SOLN
INTRAVENOUS | Status: DC | PRN
  Administered 2013-03-15: 50 mg via INTRAVENOUS

## 2013-03-15 MED ORDER — DEXAMETHASONE SODIUM PHOSPHATE 10 MG/ML IJ SOLN
INTRAMUSCULAR | Status: AC
  Filled 2013-03-15: qty 1

## 2013-03-15 MED ORDER — GLYCOPYRROLATE 0.2 MG/ML IJ SOLN
INTRAMUSCULAR | Status: AC
  Filled 2013-03-15: qty 2

## 2013-03-15 MED ORDER — SCOPOLAMINE 1 MG/3DAYS TD PT72: 1.0000 | MEDICATED_PATCH | TRANSDERMAL | Status: DC

## 2013-03-15 SURGICAL SUPPLY — 28 items
BLADE SURG 15 STRL LF C SS BP (BLADE) ×3 IMPLANT
BLADE SURG 15 STRL SS (BLADE) ×6
CLOTH BEACON ORANGE TIMEOUT ST (SAFETY) ×2 IMPLANT
CONT PATH 16OZ SNAP LID 3702 (MISCELLANEOUS) IMPLANT
DECANTER SPIKE VIAL GLASS SM (MISCELLANEOUS) IMPLANT
GAUZE PACKING 2X5 YD STERILE (GAUZE/BANDAGES/DRESSINGS) ×1 IMPLANT
GLOVE BIO SURGEON STRL SZ 6.5 (GLOVE) ×2 IMPLANT
GLOVE BIOGEL PI IND STRL 6.5 (GLOVE) ×1 IMPLANT
GLOVE BIOGEL PI INDICATOR 6.5 (GLOVE) ×1
GOWN STRL REIN XL XLG (GOWN DISPOSABLE) ×8 IMPLANT
NDL HYPO 25X5/8 SAFETYGLIDE (NEEDLE) ×1 IMPLANT
NDL SPNL 22GX3.5 QUINCKE BK (NEEDLE) IMPLANT
NEEDLE HYPO 22GX1.5 SAFETY (NEEDLE) IMPLANT
NEEDLE HYPO 25X5/8 SAFETYGLIDE (NEEDLE) ×2 IMPLANT
NEEDLE SPNL 22GX3.5 QUINCKE BK (NEEDLE) IMPLANT
NS IRRIG 1000ML POUR BTL (IV SOLUTION) ×2 IMPLANT
PACK VAGINAL WOMENS (CUSTOM PROCEDURE TRAY) ×2 IMPLANT
SUT PROLENE 1 CT 1 30 (SUTURE) IMPLANT
SUT VIC AB 0 CT1 18XCR BRD8 (SUTURE) IMPLANT
SUT VIC AB 0 CT1 27 (SUTURE) ×6
SUT VIC AB 0 CT1 27XBRD ANBCTR (SUTURE) ×4 IMPLANT
SUT VIC AB 0 CT1 8-18 (SUTURE)
SUT VIC AB 2-0 UR5 27 (SUTURE) ×5 IMPLANT
SUT VIC AB 3-0 SH 27 (SUTURE)
SUT VIC AB 3-0 SH 27XBRD (SUTURE) IMPLANT
TOWEL OR 17X24 6PK STRL BLUE (TOWEL DISPOSABLE) ×4 IMPLANT
TRAY FOLEY CATH 14FR (SET/KITS/TRAYS/PACK) ×2 IMPLANT
WATER STERILE IRR 1000ML POUR (IV SOLUTION) ×2 IMPLANT

## 2013-03-15 NOTE — Progress Notes (Signed)
History and physical on the chart. No significant changes On Ampicillin for UTI Consent signed

## 2013-03-15 NOTE — Transfer of Care (Signed)
Immediate Anesthesia Transfer of Care Note  Patient: Amanda Willis  Procedure(s) Performed: Procedure(s): POSTERIOR REPAIR (N/A)  Patient Location: PACU  Anesthesia Type:General  Level of Consciousness: awake, alert  and oriented  Airway & Oxygen Therapy: Patient Spontanous Breathing and Patient connected to nasal cannula oxygen  Post-op Assessment: Report given to PACU RN and Post -op Vital signs reviewed and stable  Post vital signs: Reviewed and stable  Complications: No apparent anesthesia complications

## 2013-03-15 NOTE — Brief Op Note (Signed)
03/15/2013  8:34 AM  PATIENT:  Amanda Willis  52 y.o. female  PRE-OPERATIVE DIAGNOSIS:  Symptomatic rectocele cpt 57250  POST-OPERATIVE DIAGNOSIS:  same  PROCEDURE:  Procedure(s): POSTERIOR REPAIR (N/A)  SURGEON:  Surgeon(s) and Role:    * Jeani Hawking, MD - Primary  PHYSICIAN ASSISTANT:   ASSISTANTS: none   ANESTHESIA:   general  EBL:  Total I/O In: 1000 [I.V.:1000] Out: -   BLOOD ADMINISTERED:none  DRAINS: Urinary Catheter (Foley)   LOCAL MEDICATIONS USED:  MARCAINE     SPECIMEN:  No Specimen  DISPOSITION OF SPECIMEN:  N/A  COUNTS:  YES  TOURNIQUET:  * No tourniquets in log *  DICTATION: .Other Dictation: Dictation Number [id number]  PLAN OF CARE: Admit for overnight observation  PATIENT DISPOSITION:  PACU - hemodynamically stable.   Delay start of Pharmacological VTE agent (>24hrs) due to surgical blood loss or risk of bleeding: not applicable

## 2013-03-15 NOTE — Anesthesia Postprocedure Evaluation (Signed)
  Anesthesia Post-op Note  Anesthesia Post Note  Patient: Amanda Willis  Procedure(s) Performed: Procedure(s) (LRB): POSTERIOR REPAIR (N/A)  Anesthesia type: General  Patient location: PACU  Post pain: Pain level controlled  Post assessment: Post-op Vital signs reviewed  Last Vitals:  Filed Vitals:   03/15/13 1000  BP: 111/73  Pulse: 72  Temp: 37.1 C  Resp: 16    Post vital signs: Reviewed  Level of consciousness: sedated  Complications: No apparent anesthesia complications

## 2013-03-15 NOTE — Anesthesia Postprocedure Evaluation (Signed)
  Anesthesia Post-op Note  Patient: Amanda Willis  Procedure(s) Performed: Procedure(s): POSTERIOR REPAIR (N/A)  Patient Location: [facility]  Anesthesia Type:General  Level of Consciousness: awake, alert  and oriented  Airway and Oxygen Therapy: Patient Spontanous Breathing  Post-op Pain: mild  Post-op Assessment: Patient's Cardiovascular Status Stable, Respiratory Function Stable, Patent Airway, Adequate PO intake and Pain level controlled  Post-op Vital Signs: stable  Complications: No apparent anesthesia complications

## 2013-03-16 ENCOUNTER — Encounter (HOSPITAL_COMMUNITY): Payer: Self-pay | Admitting: Obstetrics and Gynecology

## 2013-03-16 LAB — CBC
HCT: 35.4 % — ABNORMAL LOW (ref 36.0–46.0)
Hemoglobin: 11.7 g/dL — ABNORMAL LOW (ref 12.0–15.0)
MCH: 30 pg (ref 26.0–34.0)
MCV: 90.8 fL (ref 78.0–100.0)
RBC: 3.9 MIL/uL (ref 3.87–5.11)

## 2013-03-16 MED ORDER — IBUPROFEN 600 MG PO TABS: 600.0000 mg | ORAL_TABLET | Freq: Four times a day (QID) | ORAL | Status: DC | PRN

## 2013-03-16 MED ORDER — OXYCODONE-ACETAMINOPHEN 5-325 MG PO TABS: 1.0000 | ORAL_TABLET | ORAL | Status: DC | PRN

## 2013-03-16 NOTE — Progress Notes (Signed)
Patient's vaginal packing removed. Minimal amount of drainage, no clots present. Patient tolerated well. 

## 2013-03-16 NOTE — Progress Notes (Signed)
1 Day Post-Op Procedure(s) (LRB): POSTERIOR REPAIR (N/A)  Subjective: Patient reports incisional pain, tolerating PO and no problems voiding.    Objective: I have reviewed patient's vital signs, intake and output, medications and labs.  General: alert, cooperative and appears stated age no vaginal bleeding  Assessment: s/p Procedure(s): POSTERIOR REPAIR (N/A): stable, progressing well and tolerating diet  Plan: Advance diet Discharge home  LOS: 1 day    Daily Doe L 03/16/2013, 8:42 AM

## 2013-03-16 NOTE — Op Note (Signed)
NAMEEDELIN, FRYER                 ACCOUNT NO.:  [id number]  MEDICAL RECORD NO.:  [id number]  LOCATION:  9307                          FACILITY:  [facility]  PHYSICIAN:  Corianne Buccellato L. Jensyn Cambria, M.D.DATE OF BIRTH:  1961/02/04  DATE OF PROCEDURE:  03/15/2013 DATE OF DISCHARGE:                              OPERATIVE REPORT   PREOPERATIVE DIAGNOSIS:  Symptomatic rectocele.  POSTOPERATIVE DIAGNOSIS:  Symptomatic rectocele.  PROCEDURE:  Posterior repair.  SURGEON:  Channing Yeager L. Vincente Poli, M.D.  ANESTHESIA:  General.  EBL:  100 mL.  COMPLICATIONS:  None.  PROCEDURE:  The patient was taken to the operating room.  Her anesthesia was administered.  Time-out was performed in standard fashion.  She was then prepped and draped.  A Foley catheter was then inserted and draining clear urine.  Exam under anesthesia revealed that she had a grade 1-2 cystocele that was high.  She did have a little bit of laxity of the vagina.  Allis clamps were placed at the 5 and 7 o'clock positions.  A V-shaped incision was made with a scalpel at the introitus and local was infiltrated with Marcaine with epinephrine in the area where the posterior repair was performed.  This was to help with hemostasis.  I then made a midline incision up the back wall of the vagina almost all the way up to the vaginal cuff, and this we did, it was a little tedious because there was some scarring from the previous posterior repair, but we were able to define a nice plane.  We then kind of dissected the rectovaginal fascia free from the overlying vagina and reduced the rectocele nicely.  I then used 0-Vicryl suture, and basically reapproximated the rectovaginal fascia and reduced the rectocele completely with interrupted starting distally and going proximally.  I then trimmed the excess vaginal epithelium with Metzenbaum scissors.  We then closed the incision using 2-0 Vicryl in a running locked stitch.  I did place a stitch at the  introitus to help with the perineum support with 0-Vicryl suture and closed the remainder of the perineum with a 2-0 Vicryl.  At the end of the procedure, there was no bleeding whatsoever.  Vaginal packing with Estrace cream was placed in the vagina.  The urine was draining nicely clear urine.  The patient was extubated and went to recovery room in stable condition.  All sponge, lap, and instrument counts were correct x2.     Tamaria Dunleavy L. Vincente Poli, M.D.     Florestine Avers  D:  03/15/2013  T:  03/16/2013  Job:  [id number]

## 2013-03-16 NOTE — Discharge Summary (Signed)
Admission Diagnosis: Symptomatic Rectocele  Discharge Diagnosis: Same  Hospital course: 52 year old female with symptomatic rectocele. Underwent uncomplicated posterior repair. By POD #1 foley and packing removed. Minimal pain. Discharged home in good condition with stable vital signs  Discharged home with ibuprofen and percocet Sitz bath prn Discharge precautions given Follow up with Dr. Vincente Poli in 2 weeks

## 2013-03-16 NOTE — Progress Notes (Signed)
Pt discharged home with husband... Condition stable... No equipment... Ambulated to car with E. Nicola Heinemann, RN. 

## 2014-10-31 ENCOUNTER — Emergency Department (HOSPITAL_COMMUNITY): Payer: Medicare Other

## 2014-10-31 ENCOUNTER — Emergency Department (HOSPITAL_COMMUNITY)
Admission: EM | Admit: 2014-10-31 | Discharge: 2014-10-31 | Disposition: A | Discharge status: Home / Self Care | Payer: Medicare Other | Source: Home / Self Care | Attending: Emergency Medicine | Admitting: Emergency Medicine

## 2014-10-31 ENCOUNTER — Encounter (HOSPITAL_COMMUNITY): Payer: Self-pay | Admitting: *Deleted

## 2014-10-31 DIAGNOSIS — K625 Hemorrhage of anus and rectum: Secondary | ICD-10-CM

## 2014-10-31 DIAGNOSIS — K219 Gastro-esophageal reflux disease without esophagitis: Secondary | ICD-10-CM | POA: Insufficient documentation

## 2014-10-31 DIAGNOSIS — R1013 Epigastric pain: Secondary | ICD-10-CM

## 2014-10-31 DIAGNOSIS — Z90710 Acquired absence of both cervix and uterus: Secondary | ICD-10-CM | POA: Insufficient documentation

## 2014-10-31 DIAGNOSIS — R109 Unspecified abdominal pain: Secondary | ICD-10-CM

## 2014-10-31 DIAGNOSIS — Z8639 Personal history of other endocrine, nutritional and metabolic disease: Secondary | ICD-10-CM | POA: Insufficient documentation

## 2014-10-31 DIAGNOSIS — Z8679 Personal history of other diseases of the circulatory system: Secondary | ICD-10-CM | POA: Insufficient documentation

## 2014-10-31 DIAGNOSIS — Z7982 Long term (current) use of aspirin: Secondary | ICD-10-CM | POA: Insufficient documentation

## 2014-10-31 DIAGNOSIS — Z7951 Long term (current) use of inhaled steroids: Secondary | ICD-10-CM | POA: Insufficient documentation

## 2014-10-31 DIAGNOSIS — K529 Noninfective gastroenteritis and colitis, unspecified: Secondary | ICD-10-CM | POA: Diagnosis not present

## 2014-10-31 DIAGNOSIS — I10 Essential (primary) hypertension: Secondary | ICD-10-CM | POA: Insufficient documentation

## 2014-10-31 DIAGNOSIS — R1012 Left upper quadrant pain: Secondary | ICD-10-CM | POA: Diagnosis not present

## 2014-10-31 DIAGNOSIS — Z79899 Other long term (current) drug therapy: Secondary | ICD-10-CM | POA: Insufficient documentation

## 2014-10-31 DIAGNOSIS — Z8669 Personal history of other diseases of the nervous system and sense organs: Secondary | ICD-10-CM | POA: Insufficient documentation

## 2014-10-31 DIAGNOSIS — R1032 Left lower quadrant pain: Secondary | ICD-10-CM | POA: Insufficient documentation

## 2014-10-31 LAB — URINE MICROSCOPIC-ADD ON

## 2014-10-31 LAB — URINALYSIS, ROUTINE W REFLEX MICROSCOPIC
GLUCOSE, UA: NEGATIVE mg/dL
Ketones, ur: NEGATIVE mg/dL
Nitrite: NEGATIVE
PH: 6 (ref 5.0–8.0)
PROTEIN: 30 mg/dL — AB
SPECIFIC GRAVITY, URINE: 1.025 (ref 1.005–1.030)
Urobilinogen, UA: 1 mg/dL (ref 0.0–1.0)

## 2014-10-31 LAB — COMPREHENSIVE METABOLIC PANEL
ALK PHOS: 86 U/L (ref 39–117)
ALT: 44 U/L — AB (ref 0–35)
AST: 43 U/L — AB (ref 0–37)
Albumin: 4.1 g/dL (ref 3.5–5.2)
Anion gap: 18 — ABNORMAL HIGH (ref 5–15)
BUN: 17 mg/dL (ref 6–23)
CO2: 25 mEq/L (ref 19–32)
Calcium: 9.8 mg/dL (ref 8.4–10.5)
Chloride: 99 mEq/L (ref 96–112)
Creatinine, Ser: 0.5 mg/dL (ref 0.50–1.10)
GFR calc Af Amer: >90 mL/min (ref >90)
GFR calc non Af Amer: >90 mL/min (ref >90)
GLUCOSE: 140 mg/dL — AB (ref 70–99)
POTASSIUM: 3.7 meq/L (ref 3.7–5.3)
SODIUM: 142 meq/L (ref 137–147)
TOTAL PROTEIN: 8.1 g/dL (ref 6.0–8.3)
Total Bilirubin: 0.4 mg/dL (ref 0.3–1.2)

## 2014-10-31 LAB — CBC WITH DIFFERENTIAL/PLATELET
Basophils Absolute: 0.1 10*3/uL (ref 0.0–0.1)
Basophils Relative: 0 % (ref 0–1)
Eosinophils Absolute: 0 10*3/uL (ref 0.0–0.7)
Eosinophils Relative: 0 % (ref 0–5)
HCT: 46.1 % — ABNORMAL HIGH (ref 36.0–46.0)
Hemoglobin: 15.2 g/dL — ABNORMAL HIGH (ref 12.0–15.0)
LYMPHS ABS: 2.3 10*3/uL (ref 0.7–4.0)
Lymphocytes Relative: 12 % (ref 12–46)
MCH: 30.3 pg (ref 26.0–34.0)
MCHC: 33 g/dL (ref 30.0–36.0)
MCV: 92 fL (ref 78.0–100.0)
Monocytes Absolute: 1.2 10*3/uL — ABNORMAL HIGH (ref 0.1–1.0)
Monocytes Relative: 7 % (ref 3–12)
NEUTROS PCT: 81 % — AB (ref 43–77)
Neutro Abs: 15.1 10*3/uL — ABNORMAL HIGH (ref 1.7–7.7)
PLATELETS: 330 10*3/uL (ref 150–400)
RBC: 5.01 MIL/uL (ref 3.87–5.11)
RDW: 13.2 % (ref 11.5–15.5)
WBC: 18.7 10*3/uL — AB (ref 4.0–10.5)

## 2014-10-31 LAB — POC OCCULT BLOOD, ED
FECAL OCCULT BLD: POSITIVE — AB
FECAL OCCULT BLD: POSITIVE — AB

## 2014-10-31 LAB — LIPASE, BLOOD: Lipase: 32 U/L (ref 11–59)

## 2014-10-31 MED ORDER — HYDROMORPHONE HCL 1 MG/ML IJ SOLN
1.0000 mg | Freq: Once | INTRAMUSCULAR | Status: AC
  Administered 2014-10-31: 1 mg via INTRAVENOUS
  Filled 2014-10-31: qty 1

## 2014-10-31 MED ORDER — FAMOTIDINE IN NACL 20-0.9 MG/50ML-% IV SOLN
20.0000 mg | Freq: Once | INTRAVENOUS | Status: AC
  Administered 2014-10-31: 20 mg via INTRAVENOUS
  Filled 2014-10-31: qty 50

## 2014-10-31 MED ORDER — RANITIDINE HCL 150 MG PO CAPS: 150.0000 mg | ORAL_CAPSULE | Freq: Every day | ORAL | Status: DC

## 2014-10-31 MED ORDER — ONDANSETRON HCL 4 MG/2ML IJ SOLN
4.0000 mg | Freq: Once | INTRAMUSCULAR | Status: AC
  Administered 2014-10-31: 4 mg via INTRAVENOUS
  Filled 2014-10-31: qty 2

## 2014-10-31 MED ORDER — IOHEXOL 300 MG/ML  SOLN
50.0000 mL | Freq: Once | INTRAMUSCULAR | Status: AC | PRN
  Administered 2014-10-31: 50 mL via ORAL

## 2014-10-31 MED ORDER — OXYCODONE-ACETAMINOPHEN 5-325 MG PO TABS: 1.0000 | ORAL_TABLET | Freq: Four times a day (QID) | ORAL | Status: DC | PRN

## 2014-10-31 MED ORDER — IOHEXOL 300 MG/ML  SOLN
100.0000 mL | Freq: Once | INTRAMUSCULAR | Status: AC | PRN
  Administered 2014-10-31: 100 mL via INTRAVENOUS

## 2014-10-31 MED ORDER — SODIUM CHLORIDE 0.9 % IV BOLUS (SEPSIS)
1000.0000 mL | Freq: Once | INTRAVENOUS | Status: AC
  Administered 2014-10-31: 1000 mL via INTRAVENOUS

## 2014-10-31 MED ORDER — ONDANSETRON HCL 4 MG PO TABS: 4.0000 mg | ORAL_TABLET | Freq: Four times a day (QID) | ORAL | Status: DC

## 2014-10-31 NOTE — ED Provider Notes (Signed)
CSN: [id number]     Arrival date & time 10/31/14  2500 History   First MD Initiated Contact with Patient 10/31/14 0602     Chief Complaint  Patient presents with  . Abdominal Pain     (Consider location/radiation/quality/duration/timing/severity/associated sxs/prior Treatment) HPI   53 year old female with history of GERD, hiatal hernia, hypertension who presents with complaints of upper abdominal pain. Patient reports she was awoke this morning around 12:30 with acute onset of sharp tearing pain to her upper abdomen, described as "my intestine was going to bust". Pain was 10 out of 10, patient breakout a sweat, felt nauseous, vomited nonbloody non-bilious vomit, and subsequently had 2 episodes of loose stools. States the first episode was with dark sticky stools follows with burgundy color and now it is bright red blood. This is a pain lasting for about 10-15 minutes and when the pain is intense, the patient was having difficulty breathing and chest pain. She tries taking Tums with some relief. No fever, headache, productive cough, hemoptysis, hematemesis, back pain, shoulder pain,  dysuria, vaginal discharge, or rash. Patient states she has had intermittent pain like this in the past year at least once a week but never this intense. Patient reported having endoscopy and colonoscopy 8 years ago and states the result was normal. She denies any weight changes, night sweats, or myalgias. She denies alcohol abuse or any recent trauma. Patient states she has history of GERD but this pain is different.  Past Medical History  Diagnosis Date  . Neuromuscular disorder     poss. MS- no def dx  . Hypertension   . Shortness of breath     due to wt gain  . GERD (gastroesophageal reflux disease)   . H/O hiatal hernia   . Headache(784.0)     migraines  . PONV (postoperative nausea and vomiting)     "hard to wake up"   Past Surgical History  Procedure Laterality Date  . Abdominal hysterectomy    .  Partial nephrectomy  1986  . Breast surgery    . Cervical laminectomy    . Posterior repair    . Anterior and posterior repair N/A 03/15/2013    Procedure: POSTERIOR REPAIR;  Surgeon: Cyril Mourning, MD;  Location: [facility];  Service: Gynecology;  Laterality: N/A;   No family history on file. History  Substance Use Topics  . Smoking status: Never Smoker   . Smokeless tobacco: Never Used  . Alcohol Use: No   OB History    No data available     Review of Systems  All other systems reviewed and are negative.     Allergies  Morphine and related and Reglan  Home Medications   Prior to Admission medications   Medication Sig Start Date End Date Taking? Authorizing Provider  aspirin EC 81 MG tablet Take 81 mg by mouth daily.   Yes Historical Provider, MD  esomeprazole (NEXIUM) 40 MG capsule Take 40 mg by mouth daily before breakfast.   Yes Historical Provider, MD  estradiol (ESTRACE) 1 MG tablet Take 1 mg by mouth daily.   Yes Historical Provider, MD  fluticasone (FLONASE) 50 MCG/ACT nasal spray Place 2 sprays into the nose daily as needed for rhinitis or allergies.   Yes Historical Provider, MD  hydrochlorothiazide (HYDRODIURIL) 50 MG tablet Take 50 mg by mouth daily.   Yes Historical Provider, MD  metoprolol succinate (TOPROL-XL) 50 MG 24 hr tablet Take 50 mg by mouth daily. Take with or immediately  following a meal.   Yes Historical Provider, MD  oxyCODONE-acetaminophen (PERCOCET/ROXICET) 5-325 MG per tablet Take 1-2 tablets by mouth every 4 (four) hours as needed. Patient taking differently: Take 1-2 tablets by mouth every 4 (four) hours as needed for moderate pain.  03/16/13  Yes Cyril Mourning, MD  ibuprofen (ADVIL,MOTRIN) 600 MG tablet Take 1 tablet (600 mg total) by mouth every 6 (six) hours as needed (mild pain). Patient not taking: Reported on 10/31/2014 03/16/13   Cyril Mourning, MD   BP 187/87 mmHg  Pulse 91  Temp(Src) 98 F (36.7 C) (Oral)  Resp 22  SpO2  97% Physical Exam  Constitutional: She appears well-developed and well-nourished. No distress.  HENT:  Head: Atraumatic.  Eyes: Conjunctivae are normal.  Neck: Neck supple.  Cardiovascular: Normal rate and regular rhythm.   Pulmonary/Chest: Effort normal and breath sounds normal.  Abdominal: Soft. Bowel sounds are normal. She exhibits no distension. There is tenderness (tenderness to epigastric and left upper quadrant on palpation without guarding or rebound tenderness. No peritoneal sign.).  Genitourinary:  No CVA tenderness  Chaperone present: On rectal exam, patient has normal rectal tone, no mass, scant bright red blood noted, positive Hemoccult.  Neurological: She is alert.  Skin: No rash noted.  Psychiatric: She has a normal mood and affect.  Nursing note and vitals reviewed.   ED Course  Procedures (including critical care time)  6:26 AM Patient presents with upper abdominal pain and bright red blood per rectum. Symptoms suggestive of PUD versus diverticular disease. She is hemodynamically stable, workup initiated. Patient will benefit from an abdominal and pelvis CT scan for further evaluation.  7:11 AM Patient's pain has been persistence, she received a second dose of Dilaudid with some improvement. Care discussed with Dr. Ashok Cordia who has evaluated pt.    9:01 AM Abdominal and pelvis CT scan without any acute finding to explain patient's pain. On reexamination, patient still endorsed epigastric and left upper quadrant abdominal pain. She appears uncomfortable. Will continue with pain management however if pain is not well controlled we may consider admitting the patient for further evaluation. She will benefit from a GI evaluation.  9:45 AM Pain has improved after receiving her third dose of Dilaudid. Patient felt comfortable going home. She agrees to follow-up closely with GI specialist for further evaluation. Strict return precautions discussed. Will discharge patient with  H2 blocker and PPI along with pain medication and antinausea medication. I have low suspicion for cardiopulmonary etiology is the source, her EKG shows no acute ischemic changes.  10:40 AM Patient able to tolerates by mouth. Patient would like to be discharge. Patient made aware that her blood pressure is high will need to have it rechecked by PCP in a week.   Labs Review Labs Reviewed  CBC WITH DIFFERENTIAL - Abnormal; Notable for the following:    WBC 18.7 (*)    Hemoglobin 15.2 (*)    HCT 46.1 (*)    Neutrophils Relative % 81 (*)    Neutro Abs 15.1 (*)    Monocytes Absolute 1.2 (*)    All other components within normal limits  COMPREHENSIVE METABOLIC PANEL - Abnormal; Notable for the following:    Glucose, Bld 140 (*)    AST 43 (*)    ALT 44 (*)    Anion gap 18 (*)    All other components within normal limits  URINALYSIS, ROUTINE W REFLEX MICROSCOPIC - Abnormal; Notable for the following:    Color, Urine AMBER (*)  Hgb urine dipstick MODERATE (*)    Bilirubin Urine SMALL (*)    Protein, ur 30 (*)    Leukocytes, UA SMALL (*)    All other components within normal limits  POC OCCULT BLOOD, ED - Abnormal; Notable for the following:    Fecal Occult Bld POSITIVE (*)    All other components within normal limits  LIPASE, BLOOD  URINE MICROSCOPIC-ADD ON    Imaging Review Ct Abdomen Pelvis W Contrast  10/31/2014   CLINICAL DATA:  Left-sided abdominal pain as well as right upper quadrant pain with nausea, vomiting and diarrhea. Bloody stools.  EXAM: CT ABDOMEN AND PELVIS WITH CONTRAST  TECHNIQUE: Multidetector CT imaging of the abdomen and pelvis was performed using the standard protocol following bolus administration of intravenous contrast.  CONTRAST:  137mL OMNIPAQUE IOHEXOL 300 MG/ML  SOLN  COMPARISON:  10/12/2010  FINDINGS: Lung bases demonstrate minimal dependent bibasilar atelectasis.  Abdominal images demonstrate mild diffuse hepatic steatosis with a stable subcentimeter  hypodensity over the central anterior segment of the right lobe likely a hemangioma. The spleen, pancreas, gallbladder and adrenal glands are normal. There are surgical clips adjacent the superior pole of the right kidney unchanged. Kidneys are normal in size, shape and position. There are a few small nonobstructing bilateral renal stones. Minimal right upper pole renal cortical scarring unchanged. No hydronephrosis or perinephric inflammation/ fluid. Ureters are normal. There is mild calcified plaque over the abdominal aorta. Appendix is not well seen. There is a midline periumbilical hernia just above the umbilicus containing only peritoneal fat. There is minimal diverticulosis of the sigmoid colon moderate transverse and descending/sigmoid colon are decompressed.  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 The bladder and rectum are within normal. The there are multiple pelvic phleboliths screw at there is mild joint change of the spine and hips.  IMPRESSION: No acute findings in the abdomen/pelvis.  Mild hepatic steatosis with stable subcentimeter hypodensity over the right lobe likely a small hemangioma.  Minimal cortical scarring upper pole right kidney with adjacent surgical clips. Few small nonobstructing bilateral renal stones.  Minimal diverticulosis of the sigmoid colon.  Small periumbilical hernia containing only mesenteric fat.   Electronically Signed   By: Marin Olp M.D.   On: 10/31/2014 08:24     EKG Interpretation   Date/Time:  Thursday October 31 2014 71:06:26 EST Ventricular Rate:  79 PR Interval:  171 QRS Duration: 99 QT Interval:  405 QTC Calculation: 464 R Axis:   -34 Text Interpretation:  Sinus rhythm Left ventricular hypertrophy Left axis  deviation Baseline wander No significant change since last tracing  Confirmed by Ashok Cordia  MD, Lennette Bihari ([id number]) on 10/31/2014 6:14:37 AM      MDM   Final diagnoses:  Abdominal pain  Epigastric abdominal pain  BRBPR  (bright red blood per rectum)    BP 173/75 mmHg  Pulse 78  Temp(Src) 98.4 F (36.9 C) (Oral)  Resp 16  Ht 5' 6.5" (1.689 m)  Wt 218 lb (98.884 kg)  BMI 34.66 kg/m2  SpO2 93%  I have reviewed nursing notes and vital signs. I personally reviewed the imaging tests through PACS system  I reviewed available ER/hospitalization records thought the EMR     Domenic Moras, PA-C 10/31/14 Prue, MD 11/01/14 [phone]

## 2014-10-31 NOTE — Discharge Instructions (Signed)
Please follow up closely with GI specialist for further evaluation of your abdominal pain and rectal bleeding.  Take medications as prescribed.  Return if your pain worsen, if you have persistent bleeding or if you have other concerns.    Gastrointestinal Bleeding Gastrointestinal (GI) bleeding means there is bleeding somewhere along the digestive tract, between the mouth and anus. CAUSES  There are many different problems that can cause GI bleeding. Possible causes include:  Esophagitis. This is inflammation, irritation, or swelling of the esophagus.  Hemorrhoids.These are veins that are full of blood (engorged) in the rectum. They cause pain, inflammation, and may bleed.  Anal fissures.These are areas of painful tearing which may bleed. They are often caused by passing hard stool.  Diverticulosis.These are pouches that form on the colon over time, with age, and may bleed significantly.  Diverticulitis.This is inflammation in areas with diverticulosis. It can cause pain, fever, and bloody stools, although bleeding is rare.  Polyps and cancer. Colon cancer often starts out as precancerous polyps.  Gastritis and ulcers.Bleeding from the upper gastrointestinal tract (near the stomach) may travel through the intestines and produce black, sometimes tarry, often bad smelling stools. In certain cases, if the bleeding is fast enough, the stools may not be black, but red. This condition may be life-threatening. SYMPTOMS   Vomiting bright red blood or material that looks like coffee grounds.  Bloody, black, or tarry stools. DIAGNOSIS  Your caregiver may diagnose your condition by taking your history and performing a physical exam. More tests may be needed, including:  X-rays and other imaging tests.  Esophagogastroduodenoscopy (EGD). This test uses a flexible, lighted tube to look at your esophagus, stomach, and small intestine.  Colonoscopy. This test uses a flexible, lighted tube to  look at your colon. TREATMENT  Treatment depends on the cause of your bleeding.   For bleeding from the esophagus, stomach, small intestine, or colon, the caregiver doing your EGD or colonoscopy may be able to stop the bleeding as part of the procedure.  Inflammation or infection of the colon can be treated with medicines.  Many rectal problems can be treated with creams, suppositories, or warm baths.  Surgery is sometimes needed.  Blood transfusions are sometimes needed if you have lost a lot of blood. If bleeding is slow, you may be allowed to go home. If there is a lot of bleeding, you will need to stay in the hospital for observation. HOME CARE INSTRUCTIONS   Take any medicines exactly as prescribed.  Keep your stools soft by eating foods that are high in fiber. These foods include whole grains, legumes, fruits, and vegetables. Prunes (1 to 3 a day) work well for many people.  Drink enough fluids to keep your urine clear or pale yellow. SEEK IMMEDIATE MEDICAL CARE IF:   Your bleeding increases.  You feel lightheaded, weak, or you faint.  You have severe cramps in your back or abdomen.  You pass large blood clots in your stool.  Your problems are getting worse. MAKE SURE YOU:   Understand these instructions.  Will watch your condition.  Will get help right away if you are not doing well or get worse. Document Released: 10/29/2000 Document Revised: 10/18/2012 Document Reviewed: 10/11/2011 [facility] Patient Information 2015 [place], [place]. This information is not intended to replace advice given to you by your health care provider. Make sure you discuss any questions you have with your health care provider.

## 2014-10-31 NOTE — ED Notes (Signed)
MD at bedside. 

## 2014-10-31 NOTE — ED Notes (Signed)
MD at bedside. EDP STEINL PRESENT TO EVALUATE THIS PT 

## 2014-10-31 NOTE — ED Notes (Signed)
Patient transported to CT 

## 2014-10-31 NOTE — ED Notes (Signed)
Pt states around 3 am this morning she started having L sided abdominal pain, states started out burning then sharp, has had 2 episodes of diarrhea, states the last one had bright red blood in with stool, pt states pain is severe that its giving her the dry heaves.

## 2014-10-31 NOTE — ED Notes (Signed)
Pt ambulated to restroom, dry heaving in restroom, came out very diaphoretic and shaking, EKG to be done, pt states has chest pain when vomiting.

## 2014-10-31 NOTE — ED Notes (Signed)
Pt reports R upper abd pain with nausea since 0030.  Pt reports dry heaving as well with bloody stool.  Pt reports stool started off as "black and sticky then burgundy, now it's bright red."  Pt reports she has been having abd pain for a year but not this severe.

## 2014-10-31 NOTE — ED Notes (Signed)
MD at bedside. EDPA BOWIE PRESENT TO EVALUATE THIS PT

## 2014-11-01 ENCOUNTER — Inpatient Hospital Stay (HOSPITAL_COMMUNITY): Payer: Medicare Other

## 2014-11-01 ENCOUNTER — Inpatient Hospital Stay (HOSPITAL_COMMUNITY)
Admission: AD | Admit: 2014-11-01 | Discharge: 2014-11-05 | DRG: 392 | Disposition: A | Discharge status: Home / Self Care | Payer: Medicare Other | Source: Ambulatory Visit | Attending: Internal Medicine | Admitting: Internal Medicine

## 2014-11-01 ENCOUNTER — Encounter (HOSPITAL_COMMUNITY): Payer: Self-pay | Admitting: Physician Assistant

## 2014-11-01 DIAGNOSIS — Z7982 Long term (current) use of aspirin: Secondary | ICD-10-CM

## 2014-11-01 DIAGNOSIS — E785 Hyperlipidemia, unspecified: Secondary | ICD-10-CM | POA: Diagnosis present

## 2014-11-01 DIAGNOSIS — Z90710 Acquired absence of both cervix and uterus: Secondary | ICD-10-CM | POA: Diagnosis not present

## 2014-11-01 DIAGNOSIS — K529 Noninfective gastroenteritis and colitis, unspecified: Secondary | ICD-10-CM | POA: Diagnosis present

## 2014-11-01 DIAGNOSIS — K589 Irritable bowel syndrome without diarrhea: Secondary | ICD-10-CM | POA: Diagnosis present

## 2014-11-01 DIAGNOSIS — D1803 Hemangioma of intra-abdominal structures: Secondary | ICD-10-CM | POA: Diagnosis present

## 2014-11-01 DIAGNOSIS — R1012 Left upper quadrant pain: Secondary | ICD-10-CM

## 2014-11-01 DIAGNOSIS — K219 Gastro-esophageal reflux disease without esophagitis: Secondary | ICD-10-CM | POA: Diagnosis present

## 2014-11-01 DIAGNOSIS — I10 Essential (primary) hypertension: Secondary | ICD-10-CM | POA: Diagnosis present

## 2014-11-01 DIAGNOSIS — R1032 Left lower quadrant pain: Secondary | ICD-10-CM

## 2014-11-01 DIAGNOSIS — K760 Fatty (change of) liver, not elsewhere classified: Secondary | ICD-10-CM | POA: Diagnosis present

## 2014-11-01 DIAGNOSIS — R109 Unspecified abdominal pain: Secondary | ICD-10-CM

## 2014-11-01 DIAGNOSIS — Z885 Allergy status to narcotic agent status: Secondary | ICD-10-CM | POA: Diagnosis not present

## 2014-11-01 HISTORY — DX: Migraine, unspecified, not intractable, without status migrainosus: G43.909

## 2014-11-01 HISTORY — DX: Family history of other specified conditions: Z84.89

## 2014-11-01 LAB — COMPREHENSIVE METABOLIC PANEL
ALBUMIN: 3.7 g/dL (ref 3.5–5.2)
ALT: 30 U/L (ref 0–35)
ANION GAP: 15 (ref 5–15)
AST: 27 U/L (ref 0–37)
Alkaline Phosphatase: 66 U/L (ref 39–117)
BILIRUBIN TOTAL: 0.6 mg/dL (ref 0.3–1.2)
BUN: 13 mg/dL (ref 6–23)
CALCIUM: 9.2 mg/dL (ref 8.4–10.5)
CHLORIDE: 100 meq/L (ref 96–112)
CO2: 25 mEq/L (ref 19–32)
CREATININE: 0.5 mg/dL (ref 0.50–1.10)
GFR calc Af Amer: >90 mL/min (ref >90)
GFR calc non Af Amer: >90 mL/min (ref >90)
Glucose, Bld: 103 mg/dL — ABNORMAL HIGH (ref 70–99)
Potassium: 3.7 mEq/L (ref 3.7–5.3)
Sodium: 140 mEq/L (ref 137–147)
TOTAL PROTEIN: 7.3 g/dL (ref 6.0–8.3)

## 2014-11-01 LAB — PROTIME-INR
INR: 1.08 (ref 0.00–1.49)
Prothrombin Time: 14.1 seconds (ref 11.6–15.2)

## 2014-11-01 MED ORDER — ALUM & MAG HYDROXIDE-SIMETH 200-200-20 MG/5ML PO SUSP
30.0000 mL | Freq: Four times a day (QID) | ORAL | Status: DC | PRN
  Administered 2014-11-02 – 2014-11-04 (×4): 30 mL via ORAL
  Filled 2014-11-01 (×3): qty 30

## 2014-11-01 MED ORDER — ONDANSETRON HCL 4 MG/2ML IJ SOLN
4.0000 mg | Freq: Four times a day (QID) | INTRAMUSCULAR | Status: DC | PRN
  Administered 2014-11-01 – 2014-11-03 (×3): 4 mg via INTRAVENOUS
  Filled 2014-11-01 (×3): qty 2

## 2014-11-01 MED ORDER — HYDROMORPHONE HCL 1 MG/ML IJ SOLN
1.0000 mg | INTRAMUSCULAR | Status: DC | PRN
  Administered 2014-11-01 – 2014-11-05 (×27): 1 mg via INTRAVENOUS
  Filled 2014-11-01 (×28): qty 1

## 2014-11-01 MED ORDER — HYDROMORPHONE HCL 1 MG/ML IJ SOLN
1.0000 mg | INTRAMUSCULAR | Status: DC | PRN
  Administered 2014-11-01: 1 mg via INTRAVENOUS

## 2014-11-01 MED ORDER — HYDROCHLOROTHIAZIDE 50 MG PO TABS
50.0000 mg | ORAL_TABLET | Freq: Every day | ORAL | Status: DC
  Filled 2014-11-01 (×2): qty 1

## 2014-11-01 MED ORDER — HYOSCYAMINE SULFATE 0.125 MG SL SUBL
0.1250 mg | SUBLINGUAL_TABLET | SUBLINGUAL | Status: DC | PRN
  Administered 2014-11-01 – 2014-11-04 (×8): 0.125 mg via SUBLINGUAL
  Filled 2014-11-01 (×13): qty 1

## 2014-11-01 MED ORDER — METOPROLOL SUCCINATE ER 50 MG PO TB24
50.0000 mg | ORAL_TABLET | Freq: Every day | ORAL | Status: DC
  Administered 2014-11-03 – 2014-11-05 (×3): 50 mg via ORAL
  Filled 2014-11-01 (×4): qty 1

## 2014-11-01 MED ORDER — ONDANSETRON HCL 4 MG PO TABS: 4.0000 mg | ORAL_TABLET | Freq: Four times a day (QID) | ORAL | Status: DC | PRN

## 2014-11-01 MED ORDER — ENOXAPARIN SODIUM 40 MG/0.4ML ~~LOC~~ SOLN: 40.0000 mg | Freq: Every day | SUBCUTANEOUS | Status: DC

## 2014-11-01 MED ORDER — SODIUM CHLORIDE 0.9 % IV SOLN
INTRAVENOUS | Status: DC
  Administered 2014-11-01 (×2): via INTRAVENOUS
  Administered 2014-11-02: 150 mL/h via INTRAVENOUS
  Administered 2014-11-02 – 2014-11-04 (×3): via INTRAVENOUS

## 2014-11-01 MED ORDER — PIPERACILLIN-TAZOBACTAM 3.375 G IVPB
3.3750 g | Freq: Three times a day (TID) | INTRAVENOUS | Status: DC
  Administered 2014-11-01 – 2014-11-05 (×12): 3.375 g via INTRAVENOUS
  Filled 2014-11-01 (×15): qty 50

## 2014-11-01 MED ORDER — IOHEXOL 350 MG/ML SOLN
100.0000 mL | Freq: Once | INTRAVENOUS | Status: AC | PRN
  Administered 2014-11-01: 100 mL via INTRAVENOUS

## 2014-11-01 MED ORDER — MORPHINE SULFATE 2 MG/ML IJ SOLN: 2.0000 mg | INTRAMUSCULAR | Status: DC | PRN

## 2014-11-01 MED ORDER — ASPIRIN EC 81 MG PO TBEC
81.0000 mg | DELAYED_RELEASE_TABLET | Freq: Every day | ORAL | Status: DC
  Administered 2014-11-01: 81 mg via ORAL
  Filled 2014-11-01 (×2): qty 1

## 2014-11-01 MED ORDER — PANTOPRAZOLE SODIUM 40 MG PO TBEC
40.0000 mg | DELAYED_RELEASE_TABLET | Freq: Every day | ORAL | Status: DC
  Administered 2014-11-01: 40 mg via ORAL
  Filled 2014-11-01 (×2): qty 1

## 2014-11-01 MED ORDER — HYDROMORPHONE HCL 1 MG/ML IJ SOLN
INTRAMUSCULAR | Status: AC
  Administered 2014-11-01: 1 mg via INTRAVENOUS
  Filled 2014-11-01: qty 1

## 2014-11-01 NOTE — Consult Note (Signed)
[facility] Consult: 1:06 PM 11/01/2014  LOS: 0 days    Referring Provider: Dr Ardeth Perfect  Primary Care Physician:  Tamsen Roers, MD Primary Gastroenterologist:  Althia Forts.    Reason for Consultation:  Bloody stools and abdominal pain.    HPI: Amanda Willis is a 53 y.o. female.  s/p cervical fusion 2009/ 2010.  S/p hemorrhoidectomy 03/2007.  S/p rectocele repair 03/2007 and 03/2013. Fatty liver on ultrasound 05/2012.  On Nexium for GERD. Remote colon/EGD >8 yrs ago with Dr Earlean Shawl, she has not seen him since.   Seen early yesterday AM in ED for left abdominal pain, sharp and burning. Bloody, loose stools. Dry heaves.  Disphoresis.   CT scan with hepatic steatosis, hypodensity right lobe likely an hemangioma.  Mild calcified plaque over the abdominal aorta.  Midline periumbilical hernia just above the umbilicus containing only peritoneal fat. There is minimal diverticulosis of the sigmoid colon moderate in transverse,  descending/sigmoid colon are decompressed. No colitis  Labs with Hgb 15.2, WBCs 18.7. Chemistries, including Lipase, mostly normal with minor bump of transaminases into low 40s, Glucose 140.  IV dialudid, total of 3 mg relieved the pain but she contiued having the bloody stool.  Was treated with IVF, Dilaudid, IV Pepcid and sent home with Percocet RX.  Seen at [facility] office today, the bleeding continues and the left sided pain continues, worse in upper left  than lower left and radiates into left scapular/thoracic area.  10/10 when intense.   Generally the patient does not have GI complaints. Daily Nexium controls reflux symptoms well. No dysphagia. No constipation or diarrhea. No significant weight fluctuation. Appetite is generally good. Does not consume alcoholic beverages. Does not use NSAIDs.         Past Medical History  Diagnosis Date  . Neuromuscular disorder     poss. MS- no def dx  . Hypertension   . Shortness of breath     due to wt gain  . GERD (gastroesophageal reflux disease)   . H/O hiatal hernia   . Headache(784.0)     migraines  . PONV (postoperative nausea and vomiting)     "hard to wake up"  . Fatty liver 05/2012    noted on ultrasound.     Past Surgical History  Procedure Laterality Date  . Abdominal hysterectomy    . Partial nephrectomy  1986  . Breast surgery    . Cervical fusion  10/2009, 04/2008  . Rectocele repair  03/2007    Dr Helane Rima.  posterior repair.   Marland Kitchen Anterior and posterior repair N/A 03/15/2013    Procedure: POSTERIOR REPAIR;  Surgeon: Cyril Mourning, MD;  Location: [facility];  Service: Gynecology;  Laterality: N/A;    Prior to Admission medications   Medication Sig Start Date End Date Taking? Authorizing Provider  aspirin EC 81 MG tablet Take 81 mg by mouth daily.    Historical Provider, MD  esomeprazole (NEXIUM) 40 MG capsule Take 40 mg by mouth daily before breakfast.    Historical Provider, MD  estradiol (ESTRACE) 1 MG tablet  Take 1 mg by mouth daily.    Historical Provider, MD  fluticasone (FLONASE) 50 MCG/ACT nasal spray Place 2 sprays into the nose daily as needed for rhinitis or allergies.    Historical Provider, MD  hydrochlorothiazide (HYDRODIURIL) 50 MG tablet Take 50 mg by mouth daily.    Historical Provider, MD         metoprolol succinate (TOPROL-XL) 50 MG 24 hr tablet Take 50 mg by mouth daily. Take with or immediately following a meal.    Historical Provider, MD  ondansetron (ZOFRAN) 4 MG tablet Take 1 tablet (4 mg total) by mouth every 6 (six) hours. 10/31/14   Domenic Moras, PA-C  oxyCODONE-acetaminophen (PERCOCET/ROXICET) 5-325 MG per tablet Take 1 tablet by mouth every 6 (six) hours as needed for severe pain. 10/31/14   Domenic Moras, PA-C  ranitidine (ZANTAC) 150 MG capsule Take 1 capsule (150 mg total) by mouth daily. 10/31/14    Domenic Moras, PA-C    Scheduled Meds:  Infusions:  PRN Meds: alum & mag hydroxide-simeth, HYDROmorphone (DILAUDID) injection, hyoscyamine, ondansetron **OR** ondansetron (ZOFRAN) IV   Allergies as of 11/01/2014 - Review Complete 10/31/2014  Allergen Reaction Noted  . Morphine and related Shortness Of Breath, Itching, and Rash 02/28/2013  . Reglan [metoclopramide] Anxiety 02/28/2013    No family history on file.  History   Social History  . Marital Status: Married    Spouse Name: N/A    Number of Children: N/A  . Years of Education: N/A   Occupational History  . Not on file.   Social History Main Topics  . Smoking status: Never Smoker   . Smokeless tobacco: Never Used  . Alcohol Use: No  . Drug Use: No  . Sexual Activity: Yes    Birth Control/ Protection: Post-menopausal   Other Topics Concern  . Not on file   Social History Narrative    REVIEW OF SYSTEMS: Constitutional:  Feels weak. Pain is inhibiting her from doing much of anything that lying down. ENT:  No nose bleeds, no nasal discharge or congestion Pulm:  No pleuritic pain, no cough, no shortness of breath CV:  No palpitations, no LE edema. No chest pressure or pain GU:  No hematuria, some oliguria within the last several hours. GI:  Per history of present illness. No dysphagia. Heme:  No issues with low blood counts in past. No iron supplementation at home   Transfusions:  None Neuro:  No headaches, no peripheral tingling or numbness Derm:  No itching, no rash or sores.  Endocrine:  No sweats or chills.  No polyuria or dysuria Immunization:  Not queried. Travel:  None beyond local counties in last few months.    PHYSICAL EXAM: Vital signs in last 24 hours: There were no vitals filed for this visit. Wt Readings from Last 3 Encounters:  10/31/14 218 lb (98.884 kg)  03/15/13 230 lb (104.327 kg)  03/08/13 230 lb (104.327 kg)   General: Uncomfortable, ill but not toxic appearing white female. Head:   Facial rubor. No facial edema or asymmetry.  Eyes:  No icterus, no conjunctival pallor. Ears:  Not HOH.  Nose:  Seems a bit congested, no discharge. Mouth:  Clear, somewhat dry MM, no lesions or exudates. Neck:  No TMG, no bruit, no masses. No JVD Lungs:  Clear to auscultation and percussion bilaterally. Breath sounds diminished as she is not taking deep breaths. Heart: Slightly tachycardia, rate is regular. No MRG. Abdomen:  Not distended, obese. No masses, umbilical hernia  is not mostly visible. She is markedly tender to touch in the left upper greater than the left lower quadrant. No guarding or rebound. L sounds hypoactive..   Rectal: Deferred.   Musc/Skeltl: No joint contracture, swelling or erythema. Extremities:  No pedal edema.  Neurologic:  A and O 3. No limb weakness or asymmetric strength. No tremor. Skin:  No telangiectasia, no sores, no rash Tattoos:  None seen Nodes:  No cervical adenopathy   Psych:  Somewhat distressed and minimally agitated with the pain. She is cooperative and answers all questions appropriately.  Intake/Output from previous day:   Intake/Output this shift:    LAB RESULTS:  Recent Labs  10/31/14 0502  WBC 18.7*  HGB 15.2*  HCT 46.1*  PLT 330   BMET Lab Results  Component Value Date   NA 142 10/31/2014   NA 141 03/15/2013   NA 137 03/08/2013   K 3.7 10/31/2014   K 3.6 03/15/2013   K 4.2 03/08/2013   CL 99 10/31/2014   CL 103 03/15/2013   CL 98 03/08/2013   CO2 25 10/31/2014   CO2 26 03/15/2013   CO2 28 03/08/2013   GLUCOSE 140* 10/31/2014   GLUCOSE 123* 03/15/2013   GLUCOSE 106* 03/08/2013   BUN 17 10/31/2014   BUN 19 03/15/2013   BUN 15 03/08/2013   CREATININE 0.50 10/31/2014   CREATININE 0.59 03/15/2013   CREATININE 0.62 03/08/2013   CALCIUM 9.8 10/31/2014   CALCIUM 9.1 03/15/2013   CALCIUM 10.0 03/08/2013   LFT  Recent Labs  10/31/14 0502  PROT 8.1  ALBUMIN 4.1  AST 43*  ALT 44*  ALKPHOS 86  BILITOT 0.4    PT/INR Lab Results  Component Value Date   INR 0.99 10/21/2009   INR 1.0 04/30/2009   INR 1.0 05/09/2008   Hepatitis Panel No results for input(s): HEPBSAG, HCVAB, HEPAIGM, HEPBIGM in the last 72 hours. C-Diff No components found for: CDIFF Lipase     Component Value Date/Time   LIPASE 32 10/31/2014 0502    Drugs of Abuse  No results found for: LABOPIA, COCAINSCRNUR, LABBENZ, AMPHETMU, THCU, LABBARB   RADIOLOGY STUDIES: Ct Abdomen Pelvis W Contrast  10/31/2014   CLINICAL DATA:  Left-sided abdominal pain as well as right upper quadrant pain with nausea, vomiting and diarrhea. Bloody stools.  EXAM: CT ABDOMEN AND PELVIS WITH CONTRAST  TECHNIQUE: Multidetector CT imaging of the abdomen and pelvis was performed using the standard protocol following bolus administration of intravenous contrast.  CONTRAST:  166mL OMNIPAQUE IOHEXOL 300 MG/ML  SOLN  COMPARISON:  10/12/2010  FINDINGS: Lung bases demonstrate minimal dependent bibasilar atelectasis.  Abdominal images demonstrate mild diffuse hepatic steatosis with a stable subcentimeter hypodensity over the central anterior segment of the right lobe likely a hemangioma. The spleen, pancreas, gallbladder and adrenal glands are normal. There are surgical clips adjacent the superior pole of the right kidney unchanged. Kidneys are normal in size, shape and position. There are a few small nonobstructing bilateral renal stones. Minimal right upper pole renal cortical scarring unchanged. No hydronephrosis or perinephric inflammation/ fluid. Ureters are normal. There is mild calcified plaque over the abdominal aorta. Appendix is not well seen. There is a midline periumbilical hernia just above the umbilicus containing only peritoneal fat. There is minimal diverticulosis of the sigmoid colon moderate transverse and descending/sigmoid colon are decompressed.  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 The bladder and rectum are within normal.  The there are multiple pelvic phleboliths screw at  there is mild joint change of the spine and hips.  IMPRESSION: No acute findings in the abdomen/pelvis.  Mild hepatic steatosis with stable subcentimeter hypodensity over the right lobe likely a small hemangioma.  Minimal cortical scarring upper pole right kidney with adjacent surgical clips. Few small nonobstructing bilateral renal stones.  Minimal diverticulosis of the sigmoid colon.  Small periumbilical hernia containing only mesenteric fat.   Electronically Signed   By: Marin Olp M.D.   On: 10/31/2014 08:24    ENDOSCOPIC STUDIES: Nothing in Epic. Colon/EGD "eight years ago" "normal".   IMPRESSION:   *  Bloody stool and abdominal pain. Though CT scan is negative for colitis, it sounds like ischemic colitis.  Does take estrogen supplements since her hysterectomy/oophorectomy. Significant pain/tenderness on abdominal palpation is worrisome.  WBCs elevated.  *  Chronic spine pain, treats with when necessary Vicodin  *  History GERD, controlled with once daily Nexium.  *  Hyperglycemia.  No recorded history of DM 2.  *  Fatty liver and probable hepatic hemangioma. Minor rise in LFTs, dating back to 2010.      PLAN:     *  I discontinued the subq Lovenox, added PAS stockings. Starting Zosyn. Up to IV fluid rates to 150 per hour of normal saline.  Ordered CT angio.  All per Dr Ardis Hughs' suggestions.   There is a CBC as well as be met ordered for tomorrow morning.    Amanda Willis  11/01/2014, 1:06 PM Pager: [phone]     ________________________________________________________________________  [facility] MD note:  I personally examined the patient, reviewed the data and agree with the assessment and plan described above.  She seems to be in a lot of pain, perhaps out of proportion to her exam findings.  The CT scan yesterday was not very helpful but to my review the IV contrast was not timed very well.  I recommend IV fluids,  broad spect Iv abx, pain control, antispasm meds and repeat CT scan (CT angio of abd, pelvis). Will follow along.   Owens Loffler, MD [facility] Pager [phone]

## 2014-11-01 NOTE — Progress Notes (Signed)
ANTIBIOTIC CONSULT NOTE - INITIAL  Pharmacy Consult for zosyn Indication: ischemic colitis  Allergies  Allergen Reactions  . Morphine And Related Shortness Of Breath, Itching and Rash  . Reglan [Metoclopramide] Anxiety    Patient Measurements:   Adjusted Body Weight:   Vital Signs:   Intake/Output from previous day:   Intake/Output from this shift:    Labs:  Recent Labs  10/31/14 0502  WBC 18.7*  HGB 15.2*  PLT 330  CREATININE 0.50   Estimated Creatinine Clearance: 96.3 mL/min (by C-G formula based on Cr of 0.5). No results for input(s): VANCOTROUGH, VANCOPEAK, VANCORANDOM, GENTTROUGH, GENTPEAK, GENTRANDOM, TOBRATROUGH, TOBRAPEAK, TOBRARND, AMIKACINPEAK, AMIKACINTROU, AMIKACIN in the last 72 hours.   Microbiology: No results found for this or any previous visit (from the past 720 hour(s)).  Medical History: Past Medical History  Diagnosis Date  . Neuromuscular disorder     poss. MS- no def dx  . Hypertension   . Shortness of breath     due to wt gain  . GERD (gastroesophageal reflux disease)   . H/O hiatal hernia   . Headache(784.0)     migraines  . PONV (postoperative nausea and vomiting)     "hard to wake up"  . Fatty liver 05/2012    noted on ultrasound.     Medications:  Anti-infectives    Start     Dose/Rate Route Frequency Ordered Stop   11/01/14 1400  piperacillin-tazobactam (ZOSYN) IVPB 3.375 g     3.375 g12.5 mL/hr over 240 Minutes Intravenous Every 8 hours 11/01/14 1258       Assessment: 68 yof presented to the hospital with severe L sided abdominal pain. Concern for ischemic colitis. To start zosyn for antibiotic coverage. Pt afebrile, WBC is elevated at 18.7 and Scr is WNL.   Goal of Therapy:  Eradication of infection  Plan:  1. Zosyn 3.375gm IV Q8H (4 hr inf) *Pharmacy will sign-off as no dose adjustments are anticipated. Will follow peripherally. Please re-consult pharmacy if needed. Thanks!  Salome Arnt, PharmD, BCPS Pager #  [phone] 11/01/2014 1:01 PM

## 2014-11-01 NOTE — H&P (Signed)
Physician Admission History and Physical     PCP:   Velna Hatchet MD   Chief Complaint:  Severe L sided abdominal pain   HPI: Amanda Willis is an 53 y.o. female. Pt has hx of GERD, HTN, HLD, IBS and postmenopausal on estrogen therapy who presented to my office today w/ severe L sided abdominal pain. She presented to the ED yesterday on 12/17 after this pain awakened her in the middle of the night. It is sharp , LUQ w/ some radiation to the back and a/w BRBPR at every BM . In the ED, she had leukocytosis, normal CT A/P w/ contrast, and normal vitals. They ruled out pancreatitis w/ amylase/lipase level. Her pain was controlled and she was sent home w/ advice to f/u w/ GI. Her pain continued overnight and she is hunched over in pain in my office today. She states vicoden helps the pain some, but not complete relief. She has tolerance for minimal PO intake 2/2 her pain and experiencing some emesis in my office today. Her abdominal exam shows extreme tenderness to palpation on the L side in upper and lower quadrants.   Given I have concern this may be ischemic colitis, which can present w/ normal CT scans, in addition to her uncontrollable pain and intolerance to PO, I am direct admitting her today . I have spoken w/ [facility] who will assist in consultation.   Review of Systems:  Negative except as noted above   Past Medical History: Past Medical History  Diagnosis Date  . Neuromuscular disorder     poss. MS- no def dx  . Hypertension   . Shortness of breath     due to wt gain  . GERD (gastroesophageal reflux disease)   . H/O hiatal hernia   . Headache(784.0)     migraines  . PONV (postoperative nausea and vomiting)     "hard to wake up"  . Fatty liver 05/2012    noted on ultrasound.    Past Surgical History  Procedure Laterality Date  . Abdominal hysterectomy    . Partial nephrectomy  1986  . Breast surgery    . Cervical fusion  10/2009, 04/2008  . Rectocele repair  03/2007     Dr Helane Rima.  posterior repair.   Marland Kitchen Anterior and posterior repair N/A 03/15/2013    Procedure: POSTERIOR REPAIR;  Surgeon: Cyril Mourning, MD;  Location: [facility];  Service: Gynecology;  Laterality: N/A;    Medications: Prior to Admission medications   Medication Sig Start Date End Date Taking? Authorizing Provider  aspirin EC 81 MG tablet Take 81 mg by mouth daily.    Historical Provider, MD  esomeprazole (NEXIUM) 40 MG capsule Take 40 mg by mouth daily before breakfast.    Historical Provider, MD  estradiol (ESTRACE) 1 MG tablet Take 1 mg by mouth daily.    Historical Provider, MD  fluticasone (FLONASE) 50 MCG/ACT nasal spray Place 2 sprays into the nose daily as needed for rhinitis or allergies.    Historical Provider, MD  hydrochlorothiazide (HYDRODIURIL) 50 MG tablet Take 50 mg by mouth daily.    Historical Provider, MD  ibuprofen (ADVIL,MOTRIN) 600 MG tablet Take 1 tablet (600 mg total) by mouth every 6 (six) hours as needed (mild pain). Patient not taking: Reported on 10/31/2014 03/16/13   Cyril Mourning, MD  metoprolol succinate (TOPROL-XL) 50 MG 24 hr tablet Take 50 mg by mouth daily. Take with or immediately following a meal.  Historical Provider, MD  ondansetron (ZOFRAN) 4 MG tablet Take 1 tablet (4 mg total) by mouth every 6 (six) hours. 10/31/14   Domenic Moras, PA-C  oxyCODONE-acetaminophen (PERCOCET/ROXICET) 5-325 MG per tablet Take 1 tablet by mouth every 6 (six) hours as needed for severe pain. 10/31/14   Domenic Moras, PA-C  ranitidine (ZANTAC) 150 MG capsule Take 1 capsule (150 mg total) by mouth daily. 10/31/14   Domenic Moras, PA-C    Allergies:   Allergies  Allergen Reactions  . Morphine And Related Shortness Of Breath, Itching and Rash  . Reglan [Metoclopramide] Anxiety    Social History:  reports that she has never smoked. She has never used smokeless tobacco. She reports that she does not drink alcohol or use illicit drugs.  Family History: No family history on  file.  Physical Exam: There were no vitals filed for this visit. Gen: WF in moderate distress , hunched over in pain  HEENT: dry MM, anicteric  Lungs:CTAB, no w/r Cardio:  RRR, no mrg  Abd: soft but extremely tender to light palpation in the LUQ,LLQ ; R side is relatively benign on exam MSK: no focal deficits  Neuro: no focal deficits  Skin: no jaundice , warm , dry extremities     Labs on Admission:   Recent Labs  10/31/14 0502 11/01/14 1230  NA 142 140  K 3.7 3.7  CL 99 100  CO2 25 25  GLUCOSE 140* 103*  BUN 17 13  CREATININE 0.50 0.50  CALCIUM 9.8 9.2    Recent Labs  10/31/14 0502 11/01/14 1230  AST 43* 27  ALT 44* 30  ALKPHOS 86 66  BILITOT 0.4 0.6  PROT 8.1 7.3  ALBUMIN 4.1 3.7    Recent Labs  10/31/14 0502  LIPASE 32    Recent Labs  10/31/14 0502  WBC 18.7*  NEUTROABS 15.1*  HGB 15.2*  HCT 46.1*  MCV 92.0  PLT 330   No results for input(s): CKTOTAL, CKMB, CKMBINDEX, TROPONINI in the last 72 hours. Lab Results  Component Value Date   INR 1.08 11/01/2014   INR 0.99 10/21/2009   INR 1.0 04/30/2009   No results for input(s): TSH, T4TOTAL, T3FREE, THYROIDAB in the last 72 hours.  Invalid input(s): FREET3 No results for input(s): VITAMINB12, FOLATE, FERRITIN, TIBC, IRON, RETICCTPCT in the last 72 hours.  Radiological Exams on Admission: Ct Abdomen Pelvis W Contrast  10/31/2014   CLINICAL DATA:  Left-sided abdominal pain as well as right upper quadrant pain with nausea, vomiting and diarrhea. Bloody stools.  EXAM: CT ABDOMEN AND PELVIS WITH CONTRAST  TECHNIQUE: Multidetector CT imaging of the abdomen and pelvis was performed using the standard protocol following bolus administration of intravenous contrast.  CONTRAST:  147mL OMNIPAQUE IOHEXOL 300 MG/ML  SOLN  COMPARISON:  10/12/2010  FINDINGS: Lung bases demonstrate minimal dependent bibasilar atelectasis.  Abdominal images demonstrate mild diffuse hepatic steatosis with a stable subcentimeter  hypodensity over the central anterior segment of the right lobe likely a hemangioma. The spleen, pancreas, gallbladder and adrenal glands are normal. There are surgical clips adjacent the superior pole of the right kidney unchanged. Kidneys are normal in size, shape and position. There are a few small nonobstructing bilateral renal stones. Minimal right upper pole renal cortical scarring unchanged. No hydronephrosis or perinephric inflammation/ fluid. Ureters are normal. There is mild calcified plaque over the abdominal aorta. Appendix is not well seen. There is a midline periumbilical hernia just above the umbilicus containing only peritoneal fat. There  is minimal diverticulosis of the sigmoid colon moderate transverse and descending/sigmoid colon are decompressed.  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 The bladder and rectum are within normal. The there are multiple pelvic phleboliths screw at there is mild joint change of the spine and hips.  IMPRESSION: No acute findings in the abdomen/pelvis.  Mild hepatic steatosis with stable subcentimeter hypodensity over the right lobe likely a small hemangioma.  Minimal cortical scarring upper pole right kidney with adjacent surgical clips. Few small nonobstructing bilateral renal stones.  Minimal diverticulosis of the sigmoid colon.  Small periumbilical hernia containing only mesenteric fat.   Electronically Signed   By: Marin Olp M.D.   On: 10/31/2014 08:24   Orders placed or performed during the hospital encounter of 10/31/14  . EKG 12-Lead  . EKG 12-Lead    Assessment/Plan Principal Problem:   Abdominal pain  - concern for ischemic colitis. Admit to med-surg bed. IVF, NPO, IV pain control, prn zofran. Consult in place w/ GI for evaluation, appreciate their help. Will await GI recs prior to any further imaging, etc. Holding estrogen at this time . Continue ASA 81 from home meds   Lovenox for DVT PPx  Dispo is home once acute event is  corrected and patient comfortable. Likely 3+ days .     Larnie Heart 11/01/2014, 3:22 PM

## 2014-11-02 LAB — CBC
HCT: 37.3 % (ref 36.0–46.0)
Hemoglobin: 12.4 g/dL (ref 12.0–15.0)
MCH: 30.8 pg (ref 26.0–34.0)
MCHC: 33.2 g/dL (ref 30.0–36.0)
MCV: 92.8 fL (ref 78.0–100.0)
Platelets: 250 10*3/uL (ref 150–400)
RBC: 4.02 MIL/uL (ref 3.87–5.11)
RDW: 13.4 % (ref 11.5–15.5)
WBC: 9.9 10*3/uL (ref 4.0–10.5)

## 2014-11-02 LAB — BASIC METABOLIC PANEL
ANION GAP: 12 (ref 5–15)
BUN: 13 mg/dL (ref 6–23)
CO2: 23 mEq/L (ref 19–32)
Calcium: 8.3 mg/dL — ABNORMAL LOW (ref 8.4–10.5)
Chloride: 101 mEq/L (ref 96–112)
Creatinine, Ser: 0.54 mg/dL (ref 0.50–1.10)
GFR calc Af Amer: >90 mL/min (ref >90)
Glucose, Bld: 107 mg/dL — ABNORMAL HIGH (ref 70–99)
Potassium: 3.5 mEq/L — ABNORMAL LOW (ref 3.7–5.3)
Sodium: 136 mEq/L — ABNORMAL LOW (ref 137–147)

## 2014-11-02 MED ORDER — PANTOPRAZOLE SODIUM 40 MG IV SOLR
40.0000 mg | Freq: Every day | INTRAVENOUS | Status: DC
  Administered 2014-11-02 – 2014-11-04 (×3): 40 mg via INTRAVENOUS
  Filled 2014-11-02 (×4): qty 40

## 2014-11-02 MED ORDER — SACCHAROMYCES BOULARDII 250 MG PO CAPS
250.0000 mg | ORAL_CAPSULE | Freq: Two times a day (BID) | ORAL | Status: DC
  Administered 2014-11-02 – 2014-11-05 (×6): 250 mg via ORAL
  Filled 2014-11-02 (×8): qty 1

## 2014-11-02 MED ORDER — PROMETHAZINE HCL 25 MG/ML IJ SOLN
12.5000 mg | INTRAMUSCULAR | Status: DC | PRN
  Administered 2014-11-02 – 2014-11-04 (×8): 12.5 mg via INTRAVENOUS
  Filled 2014-11-02 (×9): qty 1

## 2014-11-02 MED ORDER — HYDRALAZINE HCL 25 MG PO TABS
25.0000 mg | ORAL_TABLET | Freq: Three times a day (TID) | ORAL | Status: DC | PRN
  Administered 2014-11-02: 25 mg via ORAL
  Filled 2014-11-02: qty 1

## 2014-11-02 NOTE — Progress Notes (Signed)
Subjective: Amanda Willis is a very pleasant lady.  Notes, CT scans reviewed.  Having abd cramping. No bloody bm in 15 hours.  Wants sips of liquids. No fever .  Appreciate GI notes and help.  Objective: Vital signs in last 24 hours: Temp:  [98.2 F (36.8 C)-98.8 F (37.1 C)] 98.3 F (36.8 C) (12/19 4196) Pulse Rate:  [63-110] 63 (12/19 0614) Resp:  [18] 18 (12/19 0614) BP: (131-183)/(62-109) 140/67 mmHg (12/19 0614) SpO2:  [94 %-99 %] 96 % (12/19 0614) Weight:  [102.513 kg (226 lb)] 102.513 kg (226 lb) (12/18 1213) Weight change:  Last BM Date: 11/01/14  Intake/Output from previous day: 12/18 0701 - 12/19 0700 In: 2812.5 [I.V.:2662.5; IV Piggyback:150] Out: -  Intake/Output this shift:    General appearance: alert, cooperative and appears stated age Resp: clear to auscultation bilaterally Cardio: regular rate and rhythm, S1, S2 normal, no murmur, click, rub or gallop GI: hyperactive BS, tender in left side of abd, no mass, no rebound or gaurding. Extremities: extremities normal, atraumatic, no cyanosis or edema Neurologic: Grossly normal   Lab Results:  Recent Labs  10/31/14 0502 11/02/14 0427  WBC 18.7* 9.9  HGB 15.2* 12.4  HCT 46.1* 37.3  PLT 330 250   BMET  Recent Labs  11/01/14 1230 11/02/14 0427  NA 140 136*  K 3.7 3.5*  CL 100 101  CO2 25 23  GLUCOSE 103* 107*  BUN 13 13  CREATININE 0.50 0.54  CALCIUM 9.2 8.3*   CMET CMP     Component Value Date/Time   NA 136* 11/02/2014 0427   K 3.5* 11/02/2014 0427   CL 101 11/02/2014 0427   CO2 23 11/02/2014 0427   GLUCOSE 107* 11/02/2014 0427   BUN 13 11/02/2014 0427   CREATININE 0.54 11/02/2014 0427   CALCIUM 8.3* 11/02/2014 0427   PROT 7.3 11/01/2014 1230   ALBUMIN 3.7 11/01/2014 1230   AST 27 11/01/2014 1230   ALT 30 11/01/2014 1230   ALKPHOS 66 11/01/2014 1230   BILITOT 0.6 11/01/2014 1230   GFRNONAA >90 11/02/2014 0427   GFRAA >90 11/02/2014 0427     Studies/Results: Ct Angio Abd/pel W/ And/or  W/o  11/01/2014   CLINICAL DATA:  Left upper quadrant abdominal pain. Bloody, loose stools. History of diverticulitis.  EXAM: CT ANGIOGRAPHY ABDOMEN AND PELVIS WITH CONTRAST AND WITHOUT CONTRAST  TECHNIQUE: Multidetector CT imaging of the abdomen and pelvis was performed using the standard protocol during bolus administration of intravenous contrast. Multiplanar reconstructed images including MIPs were obtained and reviewed to evaluate the vascular anatomy.  CONTRAST:  146mL OMNIPAQUE IOHEXOL 350 MG/ML SOLN  COMPARISON:  CT abdomen pelvis - 10/31/2014  FINDINGS: Vascular Findings:  Abdominal aorta: Scattered very minimal amount of mixed calcified and noncalcified atherosclerotic plaque within a normal caliber abdominal aorta. No abdominal aortic dissection or periaortic stranding.  Celiac artery: Widely patent. 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  SMA: Widely patent without hemodynamically significant narrowing. The distal tributaries of the SMA are widely patent without discrete intraluminal filling defect to suggest distal embolism.  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 The distal aspect of the main right renal artery is noted to demonstrate mild beaded irregularity (representative coronal images 93 through 95, series 9). No perivascular stranding.  Left Renal artery: Solitary ; widely patent without hemodynamically significant narrowing or definitive area  of vessel irregularity.  IMA: Widely patent.  Pelvic vasculature: The bilateral common, external and internal iliac arteries are of normal caliber and widely patent without hemodynamically significant narrowing.  Review of the MIP images confirms the above findings.   --------------------------------------------------------------------------------  Nonvascular Findings:  Normal hepatic  contour. There is diffuse decreased attenuation of the hepatic parenchyma on this postcontrast examination suggestive of hepatic steatosis. No discrete hepatic lesions. Normal appearance of the gallbladder. No radiopaque gallstones. No intra or extrahepatic biliary duct dilatation. No ascites.  There is symmetric enhancement and excretion of the bilateral kidneys. No definite renal stones on this postcontrast examination. No discrete renal lesions. There is an apparent postsurgical defect involving the superior pole of the right kidney with adjacent surgical clips. No urinary obstruction or perinephric stranding. Normal appearance of the bilateral adrenal glands, pancreas and spleen. Incidental note is made of a small splenule.  There is rather diffuse bowel wall thickening extending from the level of the distal transverse colon (image 38, series 10 through the distal descending colon (image 66, series 10) with minimal amount of adjacent mesenteric stranding. No definable/drainable fluid collection. Enteric contrast extends to the level of the rectum. No evidence of enteric obstruction. Scatter minimal colonic diverticulosis. The bowel is otherwise normal in course and caliber. Normal appearance of the terminal ileum. The appendix is not visualized, however there no inflammatory change within the right lower abdominal quadrant. No pneumoperitoneum, pneumatosis or portal venous gas.  Scattered shotty retroperitoneal lymph nodes are individually not enlarged by size criteria. No retroperitoneal, mesenteric, pelvic or inguinal lymphadenopathy. Several phleboliths are noted within adnexal veins anterior to the caudal aspect of the right psoas musculature.  Post hysterectomy. No discrete adnexal lesion. Normal appearance of the urinary bladder given degree distention. No free fluid in the pelvic cul-de-sac.  Limited visualization of the lower thorax demonstrates minimal grossly symmetric dependent ground-glass  atelectasis. No discrete focal airspace opacities. No pleural effusion.  Borderline cardiomegaly.  No acute or aggressive osseous abnormalities. Moderate DDD of L5-S1 with disc space height loss, endplate irregularity and posteriorly directed disc osteophyte complex at this location.  Note is made of an approximately 3.4 x 3.0 cm fat containing periumbilical hernia. Regional soft tissues appear otherwise normal.  IMPRESSION: Vascular Impression:  1. No evidence of mesenteric ischemia. 2. Scattered very minimal amount of mixed calcified and noncalcified atherosclerotic plaque within a normal caliber abdominal aorta. 3. Mild beaded irregularity with the distal aspect of the dominant right renal artery suggestive of localized fibromuscular dysplasia (FMD). Nonvascular Impression:  1. Nonspecific rather diffuse colonic wall thickening extending from the distal transverse colon to the distal descending colon, not resulting in enteric obstruction. The etiology of this wall thickening is not depicted on this examination with differential considerations including infectious and inflammatory etiologies. No evidence of perforation or definable / drainable fluid collection. Correlation with colonoscopy could be performed as clinically indicated. 2. Suspected hepatic steatosis.   Electronically Signed   By: Sandi Mariscal M.D.   On: 11/01/2014 16:44    Medications: I have reviewed the patient's current medications.  Marland Kitchen aspirin EC  81 mg Oral Daily  . hydrochlorothiazide  50 mg Oral Daily  . metoprolol succinate  50 mg Oral Daily  . pantoprazole  40 mg Oral Daily  . piperacillin-tazobactam (ZOSYN)  IV  3.375 g Intravenous Q8H     Assessment/Plan:  Principal Problem:   Abdominal pain with colitis and LGI bleeding.  Continue zosyn. Add probiotic.  Sips of clears  only ordered (stop if causes more pain).  Reduce IVF rate some.  Hopefully will be a treatable/self-limited infection but could have some sort of inflammatory  bowel disease so further eval. To come.     LOS: 1 day   Jerlyn Ly, MD 11/02/2014, 9:52 AM

## 2014-11-02 NOTE — Progress Notes (Signed)
Progress Note for [facility]  Subjective: Feeling better, but still with a significant amount of pain.  Objective: Vital signs in last 24 hours: Temp:  [98.2 F (36.8 C)-98.8 F (37.1 C)] 98.3 F (36.8 C) (12/19 9485) Pulse Rate:  [63-110] 63 (12/19 0614) Resp:  [18] 18 (12/19 0614) BP: (131-183)/(62-109) 140/67 mmHg (12/19 0614) SpO2:  [94 %-99 %] 96 % (12/19 0614) Weight:  [102.513 kg (226 lb)] 102.513 kg (226 lb) (12/18 1213) Last BM Date: 11/01/14  Intake/Output from previous day: 12/18 0701 - 12/19 0700 In: 2812.5 [I.V.:2662.5; IV Piggyback:150] Out: -  Intake/Output this shift:    General appearance: uncomfortable, but alert and oriented GI: tender in the LUQ region  Lab Results:  Recent Labs  10/31/14 0502 11/02/14 0427  WBC 18.7* 9.9  HGB 15.2* 12.4  HCT 46.1* 37.3  PLT 330 250   BMET  Recent Labs  10/31/14 0502 11/01/14 1230 11/02/14 0427  NA 142 140 136*  K 3.7 3.7 3.5*  CL 99 100 101  CO2 25 25 23   GLUCOSE 140* 103* 107*  BUN 17 13 13   CREATININE 0.50 0.50 0.54  CALCIUM 9.8 9.2 8.3*   LFT  Recent Labs  11/01/14 1230  PROT 7.3  ALBUMIN 3.7  AST 27  ALT 30  ALKPHOS 66  BILITOT 0.6   PT/INR  Recent Labs  11/01/14 1230  LABPROT 14.1  INR 1.08   Hepatitis Panel No results for input(s): HEPBSAG, HCVAB, HEPAIGM, HEPBIGM in the last 72 hours. C-Diff No results for input(s): CDIFFTOX in the last 72 hours. Fecal Lactopherrin No results for input(s): FECLLACTOFRN in the last 72 hours.  Studies/Results: Ct Angio Abd/pel W/ And/or W/o  11/01/2014   CLINICAL DATA:  Left upper quadrant abdominal pain. Bloody, loose stools. History of diverticulitis.  EXAM: CT ANGIOGRAPHY ABDOMEN AND PELVIS WITH CONTRAST AND WITHOUT CONTRAST  TECHNIQUE: Multidetector CT imaging of the abdomen and pelvis was performed using the standard protocol during bolus administration of intravenous contrast. Multiplanar reconstructed images including MIPs were  obtained and reviewed to evaluate the vascular anatomy.  CONTRAST:  13mL OMNIPAQUE IOHEXOL 350 MG/ML SOLN  COMPARISON:  CT abdomen pelvis - 10/31/2014  FINDINGS: Vascular Findings:  Abdominal aorta: Scattered very minimal amount of mixed calcified and noncalcified atherosclerotic plaque within a normal caliber abdominal aorta. No abdominal aortic dissection or periaortic stranding.  Celiac artery: Widely patent. 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  SMA: Widely patent without hemodynamically significant narrowing. The distal tributaries of the SMA are widely patent without discrete intraluminal filling defect to suggest distal embolism.  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 The distal aspect of the main right renal artery is noted to demonstrate mild beaded irregularity (representative coronal images 93 through 95, series 9). No perivascular stranding.  Left Renal artery: Solitary ; widely patent without hemodynamically significant narrowing or definitive area of vessel irregularity.  IMA: Widely patent.  Pelvic vasculature: The bilateral common, external and internal iliac arteries are of normal caliber and widely patent without hemodynamically significant narrowing.  Review of the MIP images confirms the above findings.   --------------------------------------------------------------------------------  Nonvascular Findings:  Normal hepatic contour. There is diffuse decreased attenuation of the hepatic parenchyma on this postcontrast examination suggestive of hepatic steatosis. No discrete hepatic lesions. Normal appearance of the gallbladder. No radiopaque gallstones. No intra or  extrahepatic biliary duct dilatation. No ascites.  There is symmetric enhancement and excretion of the bilateral kidneys. No definite renal stones on this postcontrast  examination. No discrete renal lesions. There is an apparent postsurgical defect involving the superior pole of the right kidney with adjacent surgical clips. No urinary obstruction or perinephric stranding. Normal appearance of the bilateral adrenal glands, pancreas and spleen. Incidental note is made of a small splenule.  There is rather diffuse bowel wall thickening extending from the level of the distal transverse colon (image 38, series 10 through the distal descending colon (image 66, series 10) with minimal amount of adjacent mesenteric stranding. No definable/drainable fluid collection. Enteric contrast extends to the level of the rectum. No evidence of enteric obstruction. Scatter minimal colonic diverticulosis. The bowel is otherwise normal in course and caliber. Normal appearance of the terminal ileum. The appendix is not visualized, however there no inflammatory change within the right lower abdominal quadrant. No pneumoperitoneum, pneumatosis or portal venous gas.  Scattered shotty retroperitoneal lymph nodes are individually not enlarged by size criteria. No retroperitoneal, mesenteric, pelvic or inguinal lymphadenopathy. Several phleboliths are noted within adnexal veins anterior to the caudal aspect of the right psoas musculature.  Post hysterectomy. No discrete adnexal lesion. Normal appearance of the urinary bladder given degree distention. No free fluid in the pelvic cul-de-sac.  Limited visualization of the lower thorax demonstrates minimal grossly symmetric dependent ground-glass atelectasis. No discrete focal airspace opacities. No pleural effusion.  Borderline cardiomegaly.  No acute or aggressive osseous abnormalities. Moderate DDD of L5-S1 with disc space height loss, endplate irregularity and posteriorly directed disc osteophyte complex at this location.  Note is made of an approximately 3.4 x 3.0 cm fat containing periumbilical hernia. Regional soft tissues appear otherwise normal.   IMPRESSION: Vascular Impression:  1. No evidence of mesenteric ischemia. 2. Scattered very minimal amount of mixed calcified and noncalcified atherosclerotic plaque within a normal caliber abdominal aorta. 3. Mild beaded irregularity with the distal aspect of the dominant right renal artery suggestive of localized fibromuscular dysplasia (FMD). Nonvascular Impression:  1. Nonspecific rather diffuse colonic wall thickening extending from the distal transverse colon to the distal descending colon, not resulting in enteric obstruction. The etiology of this wall thickening is not depicted on this examination with differential considerations including infectious and inflammatory etiologies. No evidence of perforation or definable / drainable fluid collection. Correlation with colonoscopy could be performed as clinically indicated. 2. Suspected hepatic steatosis.   Electronically Signed   By: Sandi Mariscal M.D.   On: 11/01/2014 16:44    Medications:  Scheduled: . aspirin EC  81 mg Oral Daily  . hydrochlorothiazide  50 mg Oral Daily  . metoprolol succinate  50 mg Oral Daily  . pantoprazole  40 mg Oral Daily  . piperacillin-tazobactam (ZOSYN)  IV  3.375 g Intravenous Q8H   Continuous: . sodium chloride 150 mL/hr (11/02/14 0413)    Assessment/Plan: 1) Transverse colon and descending colon colitis. 2) Severe abdominal pain.   The CT scan reveals inflammation in the transverse and descending colon, which correlates to the site of pain.  She reports that the intensity has improved, but she is still very uncomfortable.  I offered to perform a colonoscopy, but she and I agreed that she would not be able to tolerate the prep.  Plan: 1) Continue with Zosyn. 2) Continue with pain medications.  LOS: 1 day   Cavin Longman D 11/02/2014, 8:27 AM

## 2014-11-03 LAB — CBC WITH DIFFERENTIAL/PLATELET
BASOS ABS: 0.1 10*3/uL (ref 0.0–0.1)
Basophils Relative: 1 % (ref 0–1)
EOS ABS: 0.2 10*3/uL (ref 0.0–0.7)
EOS PCT: 2 % (ref 0–5)
HCT: 37.9 % (ref 36.0–46.0)
Hemoglobin: 12.3 g/dL (ref 12.0–15.0)
LYMPHS PCT: 33 % (ref 12–46)
Lymphs Abs: 3 10*3/uL (ref 0.7–4.0)
MCH: 29.4 pg (ref 26.0–34.0)
MCHC: 32.5 g/dL (ref 30.0–36.0)
MCV: 90.7 fL (ref 78.0–100.0)
Monocytes Absolute: 0.5 10*3/uL (ref 0.1–1.0)
Monocytes Relative: 5 % (ref 3–12)
Neutro Abs: 5.3 10*3/uL (ref 1.7–7.7)
Neutrophils Relative %: 59 % (ref 43–77)
PLATELETS: 259 10*3/uL (ref 150–400)
RBC: 4.18 MIL/uL (ref 3.87–5.11)
RDW: 13.2 % (ref 11.5–15.5)
WBC: 8.9 10*3/uL (ref 4.0–10.5)

## 2014-11-03 LAB — COMPREHENSIVE METABOLIC PANEL
ALT: 34 U/L (ref 0–35)
AST: 47 U/L — AB (ref 0–37)
Albumin: 3.1 g/dL — ABNORMAL LOW (ref 3.5–5.2)
Alkaline Phosphatase: 55 U/L (ref 39–117)
Anion gap: 13 (ref 5–15)
BUN: 9 mg/dL (ref 6–23)
CALCIUM: 8.6 mg/dL (ref 8.4–10.5)
CO2: 25 mEq/L (ref 19–32)
Chloride: 101 mEq/L (ref 96–112)
Creatinine, Ser: 0.57 mg/dL (ref 0.50–1.10)
GFR calc Af Amer: >90 mL/min (ref >90)
GFR calc non Af Amer: >90 mL/min (ref >90)
Glucose, Bld: 107 mg/dL — ABNORMAL HIGH (ref 70–99)
Potassium: 3.5 mEq/L — ABNORMAL LOW (ref 3.7–5.3)
Sodium: 139 mEq/L (ref 137–147)
TOTAL PROTEIN: 6.3 g/dL (ref 6.0–8.3)
Total Bilirubin: 0.5 mg/dL (ref 0.3–1.2)

## 2014-11-03 NOTE — Progress Notes (Signed)
Progress Note for [facility]  Subjective: Feeling better.  The pain is rated as a 5-7 out of 10.  No further bloody diarrhea > 24 hours.  Objective: Vital signs in last 24 hours: Temp:  [98.3 F (36.8 C)-98.8 F (37.1 C)] 98.3 F (36.8 C) (12/20 0500) Pulse Rate:  [61-74] 61 (12/20 0500) Resp:  [17-18] 17 (12/20 0500) BP: (123-162)/(53-80) 123/53 mmHg (12/20 0500) SpO2:  [95 %-100 %] 95 % (12/20 0500) Last BM Date: 11/01/14  Intake/Output from previous day: 12/19 0701 - 12/20 0700 In: 0  Out: 1950 [Urine:1950] Intake/Output this shift:    General appearance: alert and no distress GI: tender on the left side of the abdomen - improved  Lab Results:  Recent Labs  11/02/14 0427 11/03/14 0500  WBC 9.9 8.9  HGB 12.4 12.3  HCT 37.3 37.9  PLT 250 259   BMET  Recent Labs  11/01/14 1230 11/02/14 0427 11/03/14 0500  NA 140 136* 139  K 3.7 3.5* 3.5*  CL 100 101 101  CO2 25 23 25   GLUCOSE 103* 107* 107*  BUN 13 13 9   CREATININE 0.50 0.54 0.57  CALCIUM 9.2 8.3* 8.6   LFT  Recent Labs  11/03/14 0500  PROT 6.3  ALBUMIN 3.1*  AST 47*  ALT 34  ALKPHOS 55  BILITOT 0.5   PT/INR  Recent Labs  11/01/14 1230  LABPROT 14.1  INR 1.08   Hepatitis Panel No results for input(s): HEPBSAG, HCVAB, HEPAIGM, HEPBIGM in the last 72 hours. C-Diff No results for input(s): CDIFFTOX in the last 72 hours. Fecal Lactopherrin No results for input(s): FECLLACTOFRN in the last 72 hours.  Studies/Results: Ct Angio Abd/pel W/ And/or W/o  11/01/2014   CLINICAL DATA:  Left upper quadrant abdominal pain. Bloody, loose stools. History of diverticulitis.  EXAM: CT ANGIOGRAPHY ABDOMEN AND PELVIS WITH CONTRAST AND WITHOUT CONTRAST  TECHNIQUE: Multidetector CT imaging of the abdomen and pelvis was performed using the standard protocol during bolus administration of intravenous contrast. Multiplanar reconstructed images including MIPs were obtained and reviewed to evaluate the vascular  anatomy.  CONTRAST:  142mL OMNIPAQUE IOHEXOL 350 MG/ML SOLN  COMPARISON:  CT abdomen pelvis - 10/31/2014  FINDINGS: Vascular Findings:  Abdominal aorta: Scattered very minimal amount of mixed calcified and noncalcified atherosclerotic plaque within a normal caliber abdominal aorta. No abdominal aortic dissection or periaortic stranding.  Celiac artery: Widely patent. 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  SMA: Widely patent without hemodynamically significant narrowing. The distal tributaries of the SMA are widely patent without discrete intraluminal filling defect to suggest distal embolism.  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 The distal aspect of the main right renal artery is noted to demonstrate mild beaded irregularity (representative coronal images 93 through 95, series 9). No perivascular stranding.  Left Renal artery: Solitary ; widely patent without hemodynamically significant narrowing or definitive area of vessel irregularity.  IMA: Widely patent.  Pelvic vasculature: The bilateral common, external and internal iliac arteries are of normal caliber and widely patent without hemodynamically significant narrowing.  Review of the MIP images confirms the above findings.   --------------------------------------------------------------------------------  Nonvascular Findings:  Normal hepatic contour. There is diffuse decreased attenuation of the hepatic parenchyma on this postcontrast examination suggestive of hepatic steatosis. No discrete hepatic lesions. Normal appearance of the gallbladder. No radiopaque gallstones. No intra  or extrahepatic biliary duct dilatation. No ascites.  There is symmetric enhancement and excretion of the bilateral kidneys. No definite renal stones on this postcontrast examination. No discrete renal lesions. There is  an apparent postsurgical defect involving the superior pole of the right kidney with adjacent surgical clips. No urinary obstruction or perinephric stranding. Normal appearance of the bilateral adrenal glands, pancreas and spleen. Incidental note is made of a small splenule.  There is rather diffuse bowel wall thickening extending from the level of the distal transverse colon (image 38, series 10 through the distal descending colon (image 66, series 10) with minimal amount of adjacent mesenteric stranding. No definable/drainable fluid collection. Enteric contrast extends to the level of the rectum. No evidence of enteric obstruction. Scatter minimal colonic diverticulosis. The bowel is otherwise normal in course and caliber. Normal appearance of the terminal ileum. The appendix is not visualized, however there no inflammatory change within the right lower abdominal quadrant. No pneumoperitoneum, pneumatosis or portal venous gas.  Scattered shotty retroperitoneal lymph nodes are individually not enlarged by size criteria. No retroperitoneal, mesenteric, pelvic or inguinal lymphadenopathy. Several phleboliths are noted within adnexal veins anterior to the caudal aspect of the right psoas musculature.  Post hysterectomy. No discrete adnexal lesion. Normal appearance of the urinary bladder given degree distention. No free fluid in the pelvic cul-de-sac.  Limited visualization of the lower thorax demonstrates minimal grossly symmetric dependent ground-glass atelectasis. No discrete focal airspace opacities. No pleural effusion.  Borderline cardiomegaly.  No acute or aggressive osseous abnormalities. Moderate DDD of L5-S1 with disc space height loss, endplate irregularity and posteriorly directed disc osteophyte complex at this location.  Note is made of an approximately 3.4 x 3.0 cm fat containing periumbilical hernia. Regional soft tissues appear otherwise normal.  IMPRESSION: Vascular Impression:  1. No evidence of  mesenteric ischemia. 2. Scattered very minimal amount of mixed calcified and noncalcified atherosclerotic plaque within a normal caliber abdominal aorta. 3. Mild beaded irregularity with the distal aspect of the dominant right renal artery suggestive of localized fibromuscular dysplasia (FMD). Nonvascular Impression:  1. Nonspecific rather diffuse colonic wall thickening extending from the distal transverse colon to the distal descending colon, not resulting in enteric obstruction. The etiology of this wall thickening is not depicted on this examination with differential considerations including infectious and inflammatory etiologies. No evidence of perforation or definable / drainable fluid collection. Correlation with colonoscopy could be performed as clinically indicated. 2. Suspected hepatic steatosis.   Electronically Signed   By: Sandi Mariscal M.D.   On: 11/01/2014 16:44    Medications:  Scheduled: . metoprolol succinate  50 mg Oral Daily  . pantoprazole (PROTONIX) IV  40 mg Intravenous Daily  . piperacillin-tazobactam (ZOSYN)  IV  3.375 g Intravenous Q8H  . saccharomyces boulardii  250 mg Oral BID   Continuous: . sodium chloride 50 mL/hr at 11/02/14 1226    Assessment/Plan: 1) Colitis. 2) ABM pain.   She continues to make improvements.  I think she can be advanced to a regular diet and she can eat as tolerated.  Plan: 1) Regular diet. 2) Continue with Zosyn. 3) Continue with pain meds. 4) [facility] to resume care in the AM.   LOS: 2 days   Amanda Willis D 11/03/2014, 8:38 AM

## 2014-11-03 NOTE — Progress Notes (Signed)
Subjective: Feels a touch better. Tried some oatmeal and not sure yet if she will tolerate it well. Still some pain but some better today. Had a rough day yesterday.  Objective: Vital signs in last 24 hours: Temp:  [98.3 F (36.8 C)-98.8 F (37.1 C)] 98.3 F (36.8 C) (12/20 0500) Pulse Rate:  [61-74] 64 (12/20 0926) Resp:  [17-18] 17 (12/20 0500) BP: (123-162)/(53-80) 123/53 mmHg (12/20 0500) SpO2:  [95 %-100 %] 95 % (12/20 0500) Weight change:  Last BM Date: 11/01/14  Intake/Output from previous day: 12/19 0701 - 12/20 0700 In: 0  Out: 1950 [Urine:1950] Intake/Output this shift: Total I/O In: 240 [P.O.:240] Out: -   General appearance: alert, cooperative and appears stated age  Resp: clear to auscultation bilaterally Cardio: regular rate and rhythm, S1, S2 normal, no murmur, click, rub or gallop GI: still tender in LLQ. Extremities: extremities normal, atraumatic, no cyanosis or edema Neurologic: Grossly normal  Lab Results:  Recent Labs  11/02/14 0427 11/03/14 0500  WBC 9.9 8.9  HGB 12.4 12.3  HCT 37.3 37.9  PLT 250 259   BMET  Recent Labs  11/02/14 0427 11/03/14 0500  NA 136* 139  K 3.5* 3.5*  CL 101 101  CO2 23 25  GLUCOSE 107* 107*  BUN 13 9  CREATININE 0.54 0.57  CALCIUM 8.3* 8.6   CMET CMP     Component Value Date/Time   NA 139 11/03/2014 0500   K 3.5* 11/03/2014 0500   CL 101 11/03/2014 0500   CO2 25 11/03/2014 0500   GLUCOSE 107* 11/03/2014 0500   BUN 9 11/03/2014 0500   CREATININE 0.57 11/03/2014 0500   CALCIUM 8.6 11/03/2014 0500   PROT 6.3 11/03/2014 0500   ALBUMIN 3.1* 11/03/2014 0500   AST 47* 11/03/2014 0500   ALT 34 11/03/2014 0500   ALKPHOS 55 11/03/2014 0500   BILITOT 0.5 11/03/2014 0500   GFRNONAA >90 11/03/2014 0500   GFRAA >90 11/03/2014 0500     Studies/Results: Ct Angio Abd/pel W/ And/or W/o  11/01/2014   CLINICAL DATA:  Left upper quadrant abdominal pain. Bloody, loose stools. History of diverticulitis.  EXAM:  CT ANGIOGRAPHY ABDOMEN AND PELVIS WITH CONTRAST AND WITHOUT CONTRAST  TECHNIQUE: Multidetector CT imaging of the abdomen and pelvis was performed using the standard protocol during bolus administration of intravenous contrast. Multiplanar reconstructed images including MIPs were obtained and reviewed to evaluate the vascular anatomy.  CONTRAST:  128mL OMNIPAQUE IOHEXOL 350 MG/ML SOLN  COMPARISON:  CT abdomen pelvis - 10/31/2014  FINDINGS: Vascular Findings:  Abdominal aorta: Scattered very minimal amount of mixed calcified and noncalcified atherosclerotic plaque within a normal caliber abdominal aorta. No abdominal aortic dissection or periaortic stranding.  Celiac artery: Widely patent. 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  SMA: Widely patent without hemodynamically significant narrowing. The distal tributaries of the SMA are widely patent without discrete intraluminal filling defect to suggest distal embolism.  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 The distal aspect of the main right renal artery is noted to demonstrate mild beaded irregularity (representative coronal images 93 through 95, series 9). No perivascular stranding.  Left Renal artery: Solitary ; widely patent without hemodynamically significant narrowing or definitive area of vessel irregularity.  IMA: Widely patent.  Pelvic vasculature: The bilateral common, external and internal iliac arteries are of normal caliber and widely  patent without hemodynamically significant narrowing.  Review of the MIP images confirms the above findings.   --------------------------------------------------------------------------------  Nonvascular Findings:  Normal hepatic contour. There is diffuse decreased attenuation of the hepatic parenchyma on this postcontrast examination suggestive of hepatic  steatosis. No discrete hepatic lesions. Normal appearance of the gallbladder. No radiopaque gallstones. No intra or extrahepatic biliary duct dilatation. No ascites.  There is symmetric enhancement and excretion of the bilateral kidneys. No definite renal stones on this postcontrast examination. No discrete renal lesions. There is an apparent postsurgical defect involving the superior pole of the right kidney with adjacent surgical clips. No urinary obstruction or perinephric stranding. Normal appearance of the bilateral adrenal glands, pancreas and spleen. Incidental note is made of a small splenule.  There is rather diffuse bowel wall thickening extending from the level of the distal transverse colon (image 38, series 10 through the distal descending colon (image 66, series 10) with minimal amount of adjacent mesenteric stranding. No definable/drainable fluid collection. Enteric contrast extends to the level of the rectum. No evidence of enteric obstruction. Scatter minimal colonic diverticulosis. The bowel is otherwise normal in course and caliber. Normal appearance of the terminal ileum. The appendix is not visualized, however there no inflammatory change within the right lower abdominal quadrant. No pneumoperitoneum, pneumatosis or portal venous gas.  Scattered shotty retroperitoneal lymph nodes are individually not enlarged by size criteria. No retroperitoneal, mesenteric, pelvic or inguinal lymphadenopathy. Several phleboliths are noted within adnexal veins anterior to the caudal aspect of the right psoas musculature.  Post hysterectomy. No discrete adnexal lesion. Normal appearance of the urinary bladder given degree distention. No free fluid in the pelvic cul-de-sac.  Limited visualization of the lower thorax demonstrates minimal grossly symmetric dependent ground-glass atelectasis. No discrete focal airspace opacities. No pleural effusion.  Borderline cardiomegaly.  No acute or aggressive osseous  abnormalities. Moderate DDD of L5-S1 with disc space height loss, endplate irregularity and posteriorly directed disc osteophyte complex at this location.  Note is made of an approximately 3.4 x 3.0 cm fat containing periumbilical hernia. Regional soft tissues appear otherwise normal.  IMPRESSION: Vascular Impression:  1. No evidence of mesenteric ischemia. 2. Scattered very minimal amount of mixed calcified and noncalcified atherosclerotic plaque within a normal caliber abdominal aorta. 3. Mild beaded irregularity with the distal aspect of the dominant right renal artery suggestive of localized fibromuscular dysplasia (FMD). Nonvascular Impression:  1. Nonspecific rather diffuse colonic wall thickening extending from the distal transverse colon to the distal descending colon, not resulting in enteric obstruction. The etiology of this wall thickening is not depicted on this examination with differential considerations including infectious and inflammatory etiologies. No evidence of perforation or definable / drainable fluid collection. Correlation with colonoscopy could be performed as clinically indicated. 2. Suspected hepatic steatosis.   Electronically Signed   By: Sandi Mariscal M.D.   On: 11/01/2014 16:44    Medications: I have reviewed the patient's current medications. . metoprolol succinate  50 mg Oral Daily  . pantoprazole (PROTONIX) IV  40 mg Intravenous Daily  . piperacillin-tazobactam (ZOSYN)  IV  3.375 g Intravenous Q8H  . saccharomyces boulardii  250 mg Oral BID     Assessment/Plan:  Principal Problem:   Abdominal pain-Colitis.  Hopefully infectious/self limited.  GI advanced her diet today so we will see how that progresses. Continue zosyn and other measures.     LOS: 2 days   Jerlyn Ly, MD 11/03/2014, 11:14 AM

## 2014-11-04 LAB — CBC WITH DIFFERENTIAL/PLATELET
Basophils Absolute: 0.1 10*3/uL (ref 0.0–0.1)
Basophils Relative: 1 % (ref 0–1)
EOS ABS: 0.3 10*3/uL (ref 0.0–0.7)
EOS PCT: 3 % (ref 0–5)
HCT: 38.9 % (ref 36.0–46.0)
HEMOGLOBIN: 12.7 g/dL (ref 12.0–15.0)
LYMPHS ABS: 3.8 10*3/uL (ref 0.7–4.0)
LYMPHS PCT: 44 % (ref 12–46)
MCH: 30.4 pg (ref 26.0–34.0)
MCHC: 32.6 g/dL (ref 30.0–36.0)
MCV: 93.1 fL (ref 78.0–100.0)
MONOS PCT: 8 % (ref 3–12)
Monocytes Absolute: 0.6 10*3/uL (ref 0.1–1.0)
Neutro Abs: 3.9 10*3/uL (ref 1.7–7.7)
Neutrophils Relative %: 44 % (ref 43–77)
Platelets: 268 10*3/uL (ref 150–400)
RBC: 4.18 MIL/uL (ref 3.87–5.11)
RDW: 13.3 % (ref 11.5–15.5)
WBC: 8.6 10*3/uL (ref 4.0–10.5)

## 2014-11-04 MED ORDER — HYDROCODONE-ACETAMINOPHEN 5-325 MG PO TABS
1.0000 | ORAL_TABLET | ORAL | Status: DC | PRN
  Administered 2014-11-05 (×2): 2 via ORAL
  Filled 2014-11-04 (×2): qty 2

## 2014-11-04 MED ORDER — DOCUSATE SODIUM 100 MG PO CAPS
100.0000 mg | ORAL_CAPSULE | Freq: Every day | ORAL | Status: DC
  Administered 2014-11-04 – 2014-11-05 (×2): 100 mg via ORAL
  Filled 2014-11-04 (×2): qty 1

## 2014-11-04 MED ORDER — HYDROCHLOROTHIAZIDE 50 MG PO TABS
50.0000 mg | ORAL_TABLET | Freq: Every day | ORAL | Status: DC
  Administered 2014-11-04 – 2014-11-05 (×2): 50 mg via ORAL
  Filled 2014-11-04 (×2): qty 1

## 2014-11-04 MED ORDER — POLYETHYLENE GLYCOL 3350 17 G PO PACK: 17.0000 g | PACK | Freq: Every day | ORAL | Status: DC | PRN

## 2014-11-04 NOTE — Progress Notes (Addendum)
    Progress Note   Subjective  still has abdominal pain, especially when eating but feels much better than when admitted.  No diarrhea / bleeding since Saturday.    Objective   Vital signs in last 24 hours: Temp:  [98.4 F (36.9 C)-98.7 F (37.1 C)] 98.5 F (36.9 C) (12/21 0603) Pulse Rate:  [61-77] 61 (12/21 0603) Resp:  [18-19] 19 (12/21 0603) BP: (124-137)/(58-72) 124/72 mmHg (12/21 0603) SpO2:  [94 %-97 %] 94 % (12/21 0603) Last BM Date: 11/03/14 General:    Pleasant white female in NAD Abdomen:  Soft, nontender and nondistended. Normal bowel sounds. Extremities:  Without edema. Neurologic:  Alert and oriented,  grossly normal neurologically. Psych:  Cooperative. Normal mood and affect.  Lab Results:  Recent Labs  11/02/14 0427 11/03/14 0500 11/04/14 0438  WBC 9.9 8.9 8.6  HGB 12.4 12.3 12.7  HCT 37.3 37.9 38.9  PLT 250 259 268   BMET  Recent Labs  11/01/14 1230 11/02/14 0427 11/03/14 0500  NA 140 136* 139  K 3.7 3.5* 3.5*  CL 100 101 101  CO2 25 23 25   GLUCOSE 103* 107* 107*  BUN 13 13 9   CREATININE 0.50 0.54 0.57  CALCIUM 9.2 8.3* 8.6      Assessment / Plan:    53 year old female with colitis / bloody stools of undetermined etiology (ischemic vrs infectious). I don't see where she had stool studies checked. On Zosyn. No BM or bleeding since Saturday. Still having abdominal pain, especially with meals. WBC normal now.Continue supportive care for now    LOS: 3 days   Tye Savoy  11/04/2014, 9:49 AM   GI Attending Note  I have personally taken an interval history, reviewed the chart, and examined the patient.  She appears to be making progress slowly.  Recommend continuing antibiotics and slowly advancing diet.  No further GI studies for now.  Provided that she continues to improve recommend colonoscopy as an outpatient once her acute illness has resolved (I don't think she has had a colonoscopy)  Signing off.  Contact us for any further  questions.  Sandy Salaam. Deatra Ina, MD, [facility] [phone]

## 2014-11-04 NOTE — Progress Notes (Signed)
Offered Pt a bath, she stated she will do shower later, bath supplies given to Pt.

## 2014-11-04 NOTE — Progress Notes (Signed)
Subjective: Pain is improved on scale but frequency is same.  Pain w/ meals  No flatus in 24 hours  Last bloody BM was Saturday night Ate oatmeal, soup and [place] sandwich yesterday  Objective: Vital signs in last 24 hours: Temp:  [98.4 F (36.9 C)-98.7 F (37.1 C)] 98.5 F (36.9 C) (12/21 0603) Pulse Rate:  [61-77] 61 (12/21 0603) Resp:  [18-19] 19 (12/21 0603) BP: (124-137)/(58-72) 124/72 mmHg (12/21 0603) SpO2:  [94 %-97 %] 94 % (12/21 0603) Weight change:  Last BM Date: 11/03/14  Intake/Output from previous day: 12/20 0701 - 12/21 0700 In: 2410 [P.O.:2010; I.V.:350; IV Piggyback:50] Out: 2000 [Urine:2000] Intake/Output this shift:    General appearance: alert, cooperative and appears stated age  Resp: clear to auscultation bilaterally Cardio: regular rate and rhythm, S1, S2 normal, no murmur, click, rub or gallop GI: TTP in the L quadrants .  Extremities: extremities normal, atraumatic, no cyanosis or edema Neurologic: Grossly normal  Lab Results:  Recent Labs  11/03/14 0500 11/04/14 0438  WBC 8.9 8.6  HGB 12.3 12.7  HCT 37.9 38.9  PLT 259 268   BMET  Recent Labs  11/02/14 0427 11/03/14 0500  NA 136* 139  K 3.5* 3.5*  CL 101 101  CO2 23 25  GLUCOSE 107* 107*  BUN 13 9  CREATININE 0.54 0.57  CALCIUM 8.3* 8.6   CMET CMP     Component Value Date/Time   NA 139 11/03/2014 0500   K 3.5* 11/03/2014 0500   CL 101 11/03/2014 0500   CO2 25 11/03/2014 0500   GLUCOSE 107* 11/03/2014 0500   BUN 9 11/03/2014 0500   CREATININE 0.57 11/03/2014 0500   CALCIUM 8.6 11/03/2014 0500   PROT 6.3 11/03/2014 0500   ALBUMIN 3.1* 11/03/2014 0500   AST 47* 11/03/2014 0500   ALT 34 11/03/2014 0500   ALKPHOS 55 11/03/2014 0500   BILITOT 0.5 11/03/2014 0500   GFRNONAA >90 11/03/2014 0500   GFRAA >90 11/03/2014 0500     Studies/Results: No results found.  Medications: I have reviewed the patient's current medications. . hydrochlorothiazide  50 mg Oral Daily   . metoprolol succinate  50 mg Oral Daily  . pantoprazole (PROTONIX) IV  40 mg Intravenous Daily  . piperacillin-tazobactam (ZOSYN)  IV  3.375 g Intravenous Q8H  . saccharomyces boulardii  250 mg Oral BID     Assessment/Plan:  Principal Problem:  Colitis -   infectious v inflammatory. GI advanced her diet which she is tolerating some, but having pain w/ the meals. No emesis or nausea. Continue zosyn and other measures per GI. Still requiring frequent IV pain meds.  DVT ppx - SCD Dispo - home when pain tolerable      LOS: 3 days   Velna Hatchet, MD 11/04/2014, 8:17 AM

## 2014-11-05 LAB — CBC WITH DIFFERENTIAL/PLATELET
Basophils Absolute: 0.1 10*3/uL (ref 0.0–0.1)
Basophils Relative: 1 % (ref 0–1)
EOS ABS: 0.3 10*3/uL (ref 0.0–0.7)
EOS PCT: 4 % (ref 0–5)
HEMATOCRIT: 38.4 % (ref 36.0–46.0)
Hemoglobin: 12.7 g/dL (ref 12.0–15.0)
Lymphocytes Relative: 41 % (ref 12–46)
Lymphs Abs: 3.3 10*3/uL (ref 0.7–4.0)
MCH: 29.9 pg (ref 26.0–34.0)
MCHC: 33.1 g/dL (ref 30.0–36.0)
MCV: 90.4 fL (ref 78.0–100.0)
Monocytes Absolute: 0.7 10*3/uL (ref 0.1–1.0)
Monocytes Relative: 8 % (ref 3–12)
Neutro Abs: 3.8 10*3/uL (ref 1.7–7.7)
Neutrophils Relative %: 46 % (ref 43–77)
PLATELETS: 269 10*3/uL (ref 150–400)
RBC: 4.25 MIL/uL (ref 3.87–5.11)
RDW: 13.2 % (ref 11.5–15.5)
WBC: 8.1 10*3/uL (ref 4.0–10.5)

## 2014-11-05 LAB — BASIC METABOLIC PANEL
Anion gap: 7 (ref 5–15)
BUN: 9 mg/dL (ref 6–23)
CO2: 30 mmol/L (ref 19–32)
Calcium: 8.5 mg/dL (ref 8.4–10.5)
Chloride: 102 mEq/L (ref 96–112)
Creatinine, Ser: 0.7 mg/dL (ref 0.50–1.10)
GFR calc Af Amer: >90 mL/min (ref >90)
GLUCOSE: 110 mg/dL — AB (ref 70–99)
Potassium: 3.4 mmol/L — ABNORMAL LOW (ref 3.5–5.1)
SODIUM: 139 mmol/L (ref 135–145)

## 2014-11-05 MED ORDER — DSS 100 MG PO CAPS: 100.0000 mg | ORAL_CAPSULE | Freq: Every day | ORAL | Status: AC

## 2014-11-05 MED ORDER — METRONIDAZOLE 500 MG PO TABS
500.0000 mg | ORAL_TABLET | Freq: Three times a day (TID) | ORAL | Status: DC
  Administered 2014-11-05 (×2): 500 mg via ORAL
  Filled 2014-11-05 (×2): qty 1

## 2014-11-05 MED ORDER — ONDANSETRON HCL 4 MG PO TABS: 4.0000 mg | ORAL_TABLET | Freq: Four times a day (QID) | ORAL | Status: DC

## 2014-11-05 MED ORDER — HYDROCODONE-ACETAMINOPHEN 5-325 MG PO TABS: 1.0000 | ORAL_TABLET | ORAL | Status: DC | PRN

## 2014-11-05 MED ORDER — CIPROFLOXACIN HCL 500 MG PO TABS
500.0000 mg | ORAL_TABLET | Freq: Two times a day (BID) | ORAL | Status: DC
  Administered 2014-11-05: 500 mg via ORAL
  Filled 2014-11-05: qty 1

## 2014-11-05 MED ORDER — PANTOPRAZOLE SODIUM 40 MG PO TBEC
40.0000 mg | DELAYED_RELEASE_TABLET | Freq: Every day | ORAL | Status: DC
  Administered 2014-11-05: 40 mg via ORAL
  Filled 2014-11-05: qty 1

## 2014-11-05 MED ORDER — SACCHAROMYCES BOULARDII 250 MG PO CAPS: 250.0000 mg | ORAL_CAPSULE | Freq: Two times a day (BID) | ORAL | Status: DC

## 2014-11-05 MED ORDER — HYDROMORPHONE HCL 1 MG/ML IJ SOLN: 1.0000 mg | INTRAMUSCULAR | Status: DC | PRN

## 2014-11-05 MED ORDER — METRONIDAZOLE 500 MG PO TABS: 500.0000 mg | ORAL_TABLET | Freq: Three times a day (TID) | ORAL | Status: AC

## 2014-11-05 MED ORDER — CIPROFLOXACIN HCL 500 MG PO TABS: 500.0000 mg | ORAL_TABLET | Freq: Two times a day (BID) | ORAL | Status: AC

## 2014-11-05 NOTE — Progress Notes (Addendum)
Patient evaluated this AM. She is sitting in chair beside bed. She walked halls last night . Having flatus. Pain improved daily. Did pass a few small blood clots last night but no other BM. She is asking if she can go home. She tolerated her meals yesterday, although they were minimal . While she is only using IV pain meds at this time, she has never been offered PO alternative until this morning.   Today we will change abx to PO, walk in halls, change pain meds to PO and see how she does . If she continues to be stable throughout today and tolerates her antibiotics and pain meds by mouth, she will be ready for discharge home w/ GI f/u . She has visit w Dr Ardis Hughs on 2/8 at 130PM  Formal note to follow   Velna Hatchet, MD 11/05/2014 8:22 AM

## 2014-11-05 NOTE — Discharge Summary (Signed)
Physician Discharge Summary    Amanda Willis  MR#: [id number]  DOB:02/19/1961  Date of Admission: 11/01/2014 Date of Discharge: 11/05/2014  Attending Physician:Cherysh Epperly  Patient's WER:[id number], Nicki Reaper, MD  Consults:  GI   Discharge Diagnoses: Principal Problem:   Abdominal pain   Discharge Medications:   Medication List    STOP taking these medications        HYDROcodone-acetaminophen 10-325 MG per tablet  Commonly known as:  NORCO  Replaced by:  HYDROcodone-acetaminophen 5-325 MG per tablet     oxyCODONE-acetaminophen 5-325 MG per tablet  Commonly known as:  PERCOCET/ROXICET      TAKE these medications        aspirin EC 81 MG tablet  Take 81 mg by mouth daily.     ciprofloxacin 500 MG tablet  Commonly known as:  CIPRO  Take 1 tablet (500 mg total) by mouth 2 (two) times daily.     DSS 100 MG Caps  Take 100 mg by mouth daily.     esomeprazole 40 MG capsule  Commonly known as:  NEXIUM  Take 40 mg by mouth daily before breakfast.     estradiol 1 MG tablet  Commonly known as:  ESTRACE  Take 1 mg by mouth daily.     fluticasone 50 MCG/ACT nasal spray  Commonly known as:  FLONASE  Place 2 sprays into both nostrils daily as needed for rhinitis or allergies.     hydrochlorothiazide 50 MG tablet  Commonly known as:  HYDRODIURIL  Take 50 mg by mouth daily.     HYDROcodone-acetaminophen 5-325 MG per tablet  Commonly known as:  NORCO/VICODIN  Take 1-2 tablets by mouth every 4 (four) hours as needed for moderate pain.     ibuprofen 600 MG tablet  Commonly known as:  ADVIL,MOTRIN  Take 1 tablet (600 mg total) by mouth every 6 (six) hours as needed (mild pain).     metoprolol succinate 50 MG 24 hr tablet  Commonly known as:  TOPROL-XL  Take 50 mg by mouth daily. Take with or immediately following a meal.     metroNIDAZOLE 500 MG tablet  Commonly known as:  FLAGYL  Take 1 tablet (500 mg total) by mouth every 8 (eight) hours.     ondansetron 4 MG  tablet  Commonly known as:  ZOFRAN  Take 1 tablet (4 mg total) by mouth every 6 (six) hours.     ranitidine 150 MG capsule  Commonly known as:  ZANTAC  Take 1 capsule (150 mg total) by mouth daily.     saccharomyces boulardii 250 MG capsule  Commonly known as:  FLORASTOR  Take 1 capsule (250 mg total) by mouth 2 (two) times daily.        Hospital Procedures: Ct Abdomen Pelvis W Contrast  10/31/2014   CLINICAL DATA:  Left-sided abdominal pain as well as right upper quadrant pain with nausea, vomiting and diarrhea. Bloody stools.  EXAM: CT ABDOMEN AND PELVIS WITH CONTRAST  TECHNIQUE: Multidetector CT imaging of the abdomen and pelvis was performed using the standard protocol following bolus administration of intravenous contrast.  CONTRAST:  155mL OMNIPAQUE IOHEXOL 300 MG/ML  SOLN  COMPARISON:  10/12/2010  FINDINGS: Lung bases demonstrate minimal dependent bibasilar atelectasis.  Abdominal images demonstrate mild diffuse hepatic steatosis with a stable subcentimeter hypodensity over the central anterior segment of the right lobe likely a hemangioma. The spleen, pancreas, gallbladder and adrenal glands are normal. There are surgical clips adjacent the superior pole of the  right kidney unchanged. Kidneys are normal in size, shape and position. There are a few small nonobstructing bilateral renal stones. Minimal right upper pole renal cortical scarring unchanged. No hydronephrosis or perinephric inflammation/ fluid. Ureters are normal. There is mild calcified plaque over the abdominal aorta. Appendix is not well seen. There is a midline periumbilical hernia just above the umbilicus containing only peritoneal fat. There is minimal diverticulosis of the sigmoid colon moderate transverse and descending/sigmoid colon are decompressed.  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 The bladder and rectum are within normal. The there are multiple pelvic phleboliths screw at there is mild joint  change of the spine and hips.  IMPRESSION: No acute findings in the abdomen/pelvis.  Mild hepatic steatosis with stable subcentimeter hypodensity over the right lobe likely a small hemangioma.  Minimal cortical scarring upper pole right kidney with adjacent surgical clips. Few small nonobstructing bilateral renal stones.  Minimal diverticulosis of the sigmoid colon.  Small periumbilical hernia containing only mesenteric fat.   Electronically Signed   By: Marin Olp M.D.   On: 10/31/2014 08:24   Ct Angio Abd/pel W/ And/or W/o  11/01/2014   CLINICAL DATA:  Left upper quadrant abdominal pain. Bloody, loose stools. History of diverticulitis.  EXAM: CT ANGIOGRAPHY ABDOMEN AND PELVIS WITH CONTRAST AND WITHOUT CONTRAST  TECHNIQUE: Multidetector CT imaging of the abdomen and pelvis was performed using the standard protocol during bolus administration of intravenous contrast. Multiplanar reconstructed images including MIPs were obtained and reviewed to evaluate the vascular anatomy.  CONTRAST:  164mL OMNIPAQUE IOHEXOL 350 MG/ML SOLN  COMPARISON:  CT abdomen pelvis - 10/31/2014  FINDINGS: Vascular Findings:  Abdominal aorta: Scattered very minimal amount of mixed calcified and noncalcified atherosclerotic plaque within a normal caliber abdominal aorta. No abdominal aortic dissection or periaortic stranding.  Celiac artery: Widely patent. 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  SMA: Widely patent without hemodynamically significant narrowing. The distal tributaries of the SMA are widely patent without discrete intraluminal filling defect to suggest distal embolism.  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 The distal aspect of the main right renal artery is noted to demonstrate mild beaded irregularity (representative coronal images 93 through 95, series 9).  No perivascular stranding.  Left Renal artery: Solitary ; widely patent without hemodynamically significant narrowing or definitive area of vessel irregularity.  IMA: Widely patent.  Pelvic vasculature: The bilateral common, external and internal iliac arteries are of normal caliber and widely patent without hemodynamically significant narrowing.  Review of the MIP images confirms the above findings.   --------------------------------------------------------------------------------  Nonvascular Findings:  Normal hepatic contour. There is diffuse decreased attenuation of the hepatic parenchyma on this postcontrast examination suggestive of hepatic steatosis. No discrete hepatic lesions. Normal appearance of the gallbladder. No radiopaque gallstones. No intra or extrahepatic biliary duct dilatation. No ascites.  There is symmetric enhancement and excretion of the bilateral kidneys. No definite renal stones on this postcontrast examination. No discrete renal lesions. There is an apparent postsurgical defect involving the superior pole of the right kidney with adjacent surgical clips. No urinary obstruction or perinephric stranding. Normal appearance of the bilateral adrenal glands, pancreas and spleen. Incidental note is made of a small splenule.  There is rather diffuse bowel wall thickening extending from the level of the distal transverse colon (image 38, series 10 through the distal  descending colon (image 66, series 10) with minimal amount of adjacent mesenteric stranding. No definable/drainable fluid collection. Enteric contrast extends to the level of the rectum. No evidence of enteric obstruction. Scatter minimal colonic diverticulosis. The bowel is otherwise normal in course and caliber. Normal appearance of the terminal ileum. The appendix is not visualized, however there no inflammatory change within the right lower abdominal quadrant. No pneumoperitoneum, pneumatosis or portal venous gas.  Scattered shotty  retroperitoneal lymph nodes are individually not enlarged by size criteria. No retroperitoneal, mesenteric, pelvic or inguinal lymphadenopathy. Several phleboliths are noted within adnexal veins anterior to the caudal aspect of the right psoas musculature.  Post hysterectomy. No discrete adnexal lesion. Normal appearance of the urinary bladder given degree distention. No free fluid in the pelvic cul-de-sac.  Limited visualization of the lower thorax demonstrates minimal grossly symmetric dependent ground-glass atelectasis. No discrete focal airspace opacities. No pleural effusion.  Borderline cardiomegaly.  No acute or aggressive osseous abnormalities. Moderate DDD of L5-S1 with disc space height loss, endplate irregularity and posteriorly directed disc osteophyte complex at this location.  Note is made of an approximately 3.4 x 3.0 cm fat containing periumbilical hernia. Regional soft tissues appear otherwise normal.  IMPRESSION: Vascular Impression:  1. No evidence of mesenteric ischemia. 2. Scattered very minimal amount of mixed calcified and noncalcified atherosclerotic plaque within a normal caliber abdominal aorta. 3. Mild beaded irregularity with the distal aspect of the dominant right renal artery suggestive of localized fibromuscular dysplasia (FMD). Nonvascular Impression:  1. Nonspecific rather diffuse colonic wall thickening extending from the distal transverse colon to the distal descending colon, not resulting in enteric obstruction. The etiology of this wall thickening is not depicted on this examination with differential considerations including infectious and inflammatory etiologies. No evidence of perforation or definable / drainable fluid collection. Correlation with colonoscopy could be performed as clinically indicated. 2. Suspected hepatic steatosis.   Electronically Signed   By: Sandi Mariscal M.D.   On: 11/01/2014 16:44    History of Present Illness: Pt presented as direct admit from my  office on 12 / 18 because of severe abdominal pain in L quadrants of the abdomen .   Hospital Course: Colitis - CT A/P w/ contrast showed diffuse colonic wall thickening extending from the distal transverse colon to the distal descending colon, not resulting in enteric obstruction. The etiology of this wall thickening is not depicted on this examination with differential considerations including infectious and inflammatory etiologies. No evidence of mesenteric ischemia. She also had leukocytosis at that time, which resolved w/ IV abx. She was started on zosyn for emperic coverage and placed on IV pain medication and antiemetics . She showed improvement daily and started tolerating a diet 48 hours prior to discharge. She did have 1 small BM w/ blood clots while admitted but nothing otherwise and her grossly bloody BMs have resolved. Prior to discharge she was changed to PO antibiotics and she was tolerating these w/o emesis and walking the halls w/ tolerable pain. In addition, she was changed to PO narcotics and only required a dose q6 hours and this helped control her pain. She was passing flatus on day of discharge. She is requesting to go home. Given that she is tolerating PO antibiotics, PO narcotics and PO antiemetics, I think discharge home is acceptable. She was given strict instruction to return to the hospital if her ability to tolerate PO or control her pain regresses. GI did see the patient in consultation and recommends a  colonoscopy as an outpatient, but nothing further until the acute illness is completely resolved. She has been set up w/ Dr Ardis Hughs on  2/8 at 130PM. She was made aware of this follow up .       Day of Discharge Exam BP 123/64 mmHg  Pulse 61  Temp(Src) 98.5 F (36.9 C) (Oral)  Resp 16  Ht 5\' 6"  (1.676 m)  Wt 226 lb (102.513 kg)  BMI 36.49 kg/m2  SpO2 95%  Physical Exam: General appearance: WF in minimal distress Eyes: no scleral icterus Throat: oropharynx moist  without erythema Resp: CTAB, no w,r Cardio: rrr, no mrg GI: soft, minimally tender in the LUQ to deep palpation  Extremities: no clubbing, cyanosis or edema  Discharge Labs:  Recent Labs  11/03/14 0500 11/05/14 0438  NA 139 139  K 3.5* 3.4*  CL 101 102  CO2 25 30  GLUCOSE 107* 110*  BUN 9 9  CREATININE 0.57 0.70  CALCIUM 8.6 8.5    Recent Labs  11/03/14 0500  AST 47*  ALT 34  ALKPHOS 55  BILITOT 0.5  PROT 6.3  ALBUMIN 3.1*    Recent Labs  11/04/14 0438 11/05/14 0438  WBC 8.6 8.1  NEUTROABS 3.9 3.8  HGB 12.7 12.7  HCT 38.9 38.4  MCV 93.1 90.4  PLT 268 269   Lab Results  Component Value Date   INR 1.08 11/01/2014   INR 0.99 10/21/2009   INR 1.0 04/30/2009   No results for input(s): CKTOTAL, CKMB, CKMBINDEX, TROPONINI in the last 72 hours. No results for input(s): TSH, T4TOTAL, T3FREE, THYROIDAB in the last 72 hours.  Invalid input(s): FREET3 No results for input(s): VITAMINB12, FOLATE, FERRITIN, TIBC, IRON, RETICCTPCT in the last 72 hours.  Discharge instructions:     Discharge Instructions    Call MD for:  persistant nausea and vomiting    Complete by:  As directed      Call MD for:  severe uncontrolled pain    Complete by:  As directed      Diet - low sodium heart healthy    Complete by:  As directed      Discharge instructions    Complete by:  As directed   Complete antibiotics as instructed . Pick up pain medication prescription from [facility] front desk on 12/23 .     Increase activity slowly    Complete by:  As directed           01-Home or Self Care   Disposition: home   Follow-up Appts: Follow-up with Dr. Ardeth Perfect at [facility] in 7-14 days.  We will call for appointment.  Condition on Discharge: stable  Tests Needing Follow-up: none   Time spent in discharge (includes decision making & examination of pt) : 45 minutes    Signed: Arienne Gartin 11/05/2014, 3:57 PM

## 2014-11-05 NOTE — Progress Notes (Signed)
Discharge home. Home discharge instruction given, no questions verbalized. Prescription given. Advise to call Md or go to the nearest hospital if symptoms get worse.

## 2014-12-23 ENCOUNTER — Ambulatory Visit: Payer: Medicare Other | Admitting: Gastroenterology

## 2016-04-13 ENCOUNTER — Emergency Department (HOSPITAL_COMMUNITY)
Admission: EM | Admit: 2016-04-13 | Discharge: 2016-04-13 | Disposition: A | Discharge status: Home / Self Care | Payer: Medicare Other | Attending: Dermatology | Admitting: Dermatology

## 2016-04-13 ENCOUNTER — Encounter (HOSPITAL_COMMUNITY): Payer: Self-pay | Admitting: Emergency Medicine

## 2016-04-13 ENCOUNTER — Emergency Department (HOSPITAL_COMMUNITY): Payer: Medicare Other

## 2016-04-13 DIAGNOSIS — Z5321 Procedure and treatment not carried out due to patient leaving prior to being seen by health care provider: Secondary | ICD-10-CM | POA: Diagnosis not present

## 2016-04-13 DIAGNOSIS — R079 Chest pain, unspecified: Secondary | ICD-10-CM | POA: Insufficient documentation

## 2016-04-13 LAB — CBC
HCT: 46.4 % — ABNORMAL HIGH (ref 36.0–46.0)
Hemoglobin: 15.5 g/dL — ABNORMAL HIGH (ref 12.0–15.0)
MCH: 29.5 pg (ref 26.0–34.0)
MCHC: 33.4 g/dL (ref 30.0–36.0)
MCV: 88.4 fL (ref 78.0–100.0)
PLATELETS: 350 10*3/uL (ref 150–400)
RBC: 5.25 MIL/uL — AB (ref 3.87–5.11)
RDW: 13.2 % (ref 11.5–15.5)
WBC: 18.9 10*3/uL — AB (ref 4.0–10.5)

## 2016-04-13 LAB — BASIC METABOLIC PANEL
Anion gap: 13 (ref 5–15)
BUN: 17 mg/dL (ref 6–20)
CALCIUM: 10 mg/dL (ref 8.9–10.3)
CO2: 21 mmol/L — ABNORMAL LOW (ref 22–32)
CREATININE: 0.85 mg/dL (ref 0.44–1.00)
Chloride: 104 mmol/L (ref 101–111)
GFR calc non Af Amer: >60 mL/min (ref >60)
Glucose, Bld: 129 mg/dL — ABNORMAL HIGH (ref 65–99)
Potassium: 3.3 mmol/L — ABNORMAL LOW (ref 3.5–5.1)
SODIUM: 138 mmol/L (ref 135–145)

## 2016-04-13 LAB — I-STAT TROPONIN, ED: TROPONIN I, POC: 0.01 ng/mL (ref 0.00–0.08)

## 2016-04-13 NOTE — ED Notes (Signed)
Pt's name called to recheck vitals no answer 

## 2016-04-13 NOTE — ED Notes (Signed)
Pt sts mid sternal CP into back with nausea while eating lunch today; pt sts feels better at present

## 2016-08-16 ENCOUNTER — Emergency Department (HOSPITAL_COMMUNITY): Payer: Medicare Other | Admitting: Anesthesiology

## 2016-08-16 ENCOUNTER — Emergency Department (HOSPITAL_COMMUNITY): Payer: Medicare Other

## 2016-08-16 ENCOUNTER — Emergency Department (HOSPITAL_COMMUNITY)
Admission: EM | Admit: 2016-08-16 | Discharge: 2016-08-16 | Disposition: A | Discharge status: Home / Self Care | Payer: Medicare Other | Attending: Emergency Medicine | Admitting: Emergency Medicine

## 2016-08-16 ENCOUNTER — Encounter (HOSPITAL_COMMUNITY): Payer: Self-pay | Admitting: Emergency Medicine

## 2016-08-16 ENCOUNTER — Encounter (HOSPITAL_COMMUNITY)
Admission: EM | Disposition: A | Discharge status: Home / Self Care | Payer: Self-pay | Source: Home / Self Care | Attending: Emergency Medicine

## 2016-08-16 DIAGNOSIS — Z90710 Acquired absence of both cervix and uterus: Secondary | ICD-10-CM | POA: Diagnosis not present

## 2016-08-16 DIAGNOSIS — N132 Hydronephrosis with renal and ureteral calculous obstruction: Secondary | ICD-10-CM | POA: Diagnosis not present

## 2016-08-16 DIAGNOSIS — E7800 Pure hypercholesterolemia, unspecified: Secondary | ICD-10-CM | POA: Diagnosis not present

## 2016-08-16 DIAGNOSIS — N201 Calculus of ureter: Secondary | ICD-10-CM | POA: Diagnosis present

## 2016-08-16 DIAGNOSIS — Z905 Acquired absence of kidney: Secondary | ICD-10-CM | POA: Diagnosis not present

## 2016-08-16 DIAGNOSIS — I10 Essential (primary) hypertension: Secondary | ICD-10-CM | POA: Diagnosis not present

## 2016-08-16 DIAGNOSIS — K219 Gastro-esophageal reflux disease without esophagitis: Secondary | ICD-10-CM | POA: Diagnosis not present

## 2016-08-16 HISTORY — PX: CYSTOSCOPY W/ URETERAL STENT PLACEMENT: SHX1429

## 2016-08-16 LAB — CBC WITH DIFFERENTIAL/PLATELET
Basophils Absolute: 0.1 10*3/uL (ref 0.0–0.1)
Basophils Relative: 0 %
Eosinophils Absolute: 0.1 10*3/uL (ref 0.0–0.7)
Eosinophils Relative: 0 %
HEMATOCRIT: 45.7 % (ref 36.0–46.0)
HEMOGLOBIN: 15.4 g/dL — AB (ref 12.0–15.0)
LYMPHS ABS: 5.3 10*3/uL — AB (ref 0.7–4.0)
LYMPHS PCT: 38 %
MCH: 30 pg (ref 26.0–34.0)
MCHC: 33.7 g/dL (ref 30.0–36.0)
MCV: 88.9 fL (ref 78.0–100.0)
MONOS PCT: 6 %
Monocytes Absolute: 0.8 10*3/uL (ref 0.1–1.0)
NEUTROS ABS: 7.6 10*3/uL (ref 1.7–7.7)
NEUTROS PCT: 56 %
Platelets: 292 10*3/uL (ref 150–400)
RBC: 5.14 MIL/uL — AB (ref 3.87–5.11)
RDW: 13 % (ref 11.5–15.5)
WBC: 13.8 10*3/uL — AB (ref 4.0–10.5)

## 2016-08-16 LAB — URINALYSIS, ROUTINE W REFLEX MICROSCOPIC
Bilirubin Urine: NEGATIVE
Glucose, UA: NEGATIVE mg/dL
Ketones, ur: NEGATIVE mg/dL
NITRITE: NEGATIVE
PH: 6 (ref 5.0–8.0)
Protein, ur: 30 mg/dL — AB
SPECIFIC GRAVITY, URINE: 1.03 (ref 1.005–1.030)

## 2016-08-16 LAB — BASIC METABOLIC PANEL
Anion gap: 14 (ref 5–15)
BUN: 29 mg/dL — AB (ref 6–20)
CHLORIDE: 100 mmol/L — AB (ref 101–111)
CO2: 22 mmol/L (ref 22–32)
CREATININE: 0.87 mg/dL (ref 0.44–1.00)
Calcium: 9.6 mg/dL (ref 8.9–10.3)
GFR calc Af Amer: >60 mL/min (ref >60)
GFR calc non Af Amer: >60 mL/min (ref >60)
Glucose, Bld: 157 mg/dL — ABNORMAL HIGH (ref 65–99)
POTASSIUM: 3.3 mmol/L — AB (ref 3.5–5.1)
Sodium: 136 mmol/L (ref 135–145)

## 2016-08-16 LAB — URINE MICROSCOPIC-ADD ON

## 2016-08-16 SURGERY — CYSTOSCOPY, WITH RETROGRADE PYELOGRAM AND URETERAL STENT INSERTION
Anesthesia: General | Site: Ureter | Laterality: Left

## 2016-08-16 MED ORDER — ONDANSETRON HCL 4 MG/2ML IJ SOLN
INTRAMUSCULAR | Status: AC
  Filled 2016-08-16: qty 2

## 2016-08-16 MED ORDER — EPHEDRINE SULFATE 50 MG/ML IJ SOLN
INTRAMUSCULAR | Status: DC | PRN
  Administered 2016-08-16 (×2): 10 mg via INTRAVENOUS

## 2016-08-16 MED ORDER — ROCURONIUM BROMIDE 10 MG/ML (PF) SYRINGE
PREFILLED_SYRINGE | INTRAVENOUS | Status: AC
  Filled 2016-08-16: qty 10

## 2016-08-16 MED ORDER — ONDANSETRON HCL 4 MG/2ML IJ SOLN
4.0000 mg | Freq: Once | INTRAMUSCULAR | Status: AC
  Administered 2016-08-16: 4 mg via INTRAVENOUS
  Filled 2016-08-16: qty 2

## 2016-08-16 MED ORDER — HYDROMORPHONE HCL 1 MG/ML IJ SOLN: 0.2500 mg | INTRAMUSCULAR | Status: DC | PRN

## 2016-08-16 MED ORDER — SODIUM CHLORIDE 0.9 % IV BOLUS (SEPSIS)
1000.0000 mL | Freq: Once | INTRAVENOUS | Status: AC
  Administered 2016-08-16: 1000 mL via INTRAVENOUS

## 2016-08-16 MED ORDER — IOHEXOL 300 MG/ML  SOLN
INTRAMUSCULAR | Status: DC | PRN
  Administered 2016-08-16: 10 mL

## 2016-08-16 MED ORDER — DEXAMETHASONE SODIUM PHOSPHATE 10 MG/ML IJ SOLN
INTRAMUSCULAR | Status: AC
Start: 2016-08-16 — End: 2016-08-16
  Filled 2016-08-16: qty 1

## 2016-08-16 MED ORDER — 0.9 % SODIUM CHLORIDE (POUR BTL) OPTIME
TOPICAL | Status: DC | PRN
  Administered 2016-08-16: 1000 mL

## 2016-08-16 MED ORDER — MORPHINE SULFATE (PF) 4 MG/ML IV SOLN
6.0000 mg | Freq: Once | INTRAVENOUS | Status: DC
  Filled 2016-08-16: qty 2

## 2016-08-16 MED ORDER — LIDOCAINE 2% (20 MG/ML) 5 ML SYRINGE
INTRAMUSCULAR | Status: DC | PRN
  Administered 2016-08-16: 100 mg via INTRAVENOUS

## 2016-08-16 MED ORDER — DEXAMETHASONE SODIUM PHOSPHATE 10 MG/ML IJ SOLN
INTRAMUSCULAR | Status: DC | PRN
  Administered 2016-08-16: 10 mg via INTRAVENOUS

## 2016-08-16 MED ORDER — LIDOCAINE 2% (20 MG/ML) 5 ML SYRINGE
INTRAMUSCULAR | Status: AC
  Filled 2016-08-16: qty 5

## 2016-08-16 MED ORDER — MEPERIDINE HCL 50 MG/ML IJ SOLN: 6.2500 mg | INTRAMUSCULAR | Status: DC | PRN

## 2016-08-16 MED ORDER — HYDROMORPHONE HCL 1 MG/ML IJ SOLN
1.0000 mg | Freq: Once | INTRAMUSCULAR | Status: AC
  Administered 2016-08-16: 1 mg via INTRAVENOUS
  Filled 2016-08-16: qty 1

## 2016-08-16 MED ORDER — PROPOFOL 10 MG/ML IV BOLUS
INTRAVENOUS | Status: DC | PRN
  Administered 2016-08-16: 250 mg via INTRAVENOUS

## 2016-08-16 MED ORDER — KETOROLAC TROMETHAMINE 30 MG/ML IJ SOLN
30.0000 mg | Freq: Once | INTRAMUSCULAR | Status: AC
  Administered 2016-08-16: 30 mg via INTRAVENOUS
  Filled 2016-08-16: qty 1

## 2016-08-16 MED ORDER — FENTANYL CITRATE (PF) 100 MCG/2ML IJ SOLN
INTRAMUSCULAR | Status: DC | PRN
  Administered 2016-08-16: 100 ug via INTRAVENOUS

## 2016-08-16 MED ORDER — CEFAZOLIN SODIUM-DEXTROSE 2-4 GM/100ML-% IV SOLN
2.0000 g | INTRAVENOUS | Status: AC
  Administered 2016-08-16: 2 g via INTRAVENOUS

## 2016-08-16 MED ORDER — OXYCODONE-ACETAMINOPHEN 5-325 MG PO TABS: 1.0000 | ORAL_TABLET | ORAL | 0 refills | Status: DC | PRN

## 2016-08-16 MED ORDER — PROPOFOL 10 MG/ML IV BOLUS
INTRAVENOUS | Status: AC
  Filled 2016-08-16: qty 40

## 2016-08-16 MED ORDER — HYDROMORPHONE HCL 1 MG/ML IJ SOLN: 1.0000 mg | INTRAMUSCULAR | Status: DC | PRN

## 2016-08-16 MED ORDER — INFLUENZA VAC SPLIT QUAD 0.5 ML IM SUSY: 0.5000 mL | PREFILLED_SYRINGE | INTRAMUSCULAR | Status: DC

## 2016-08-16 MED ORDER — CEFAZOLIN (ANCEF) 1 G IV SOLR: 2.0000 g | INTRAVENOUS | Status: DC

## 2016-08-16 MED ORDER — CEPHALEXIN 500 MG PO CAPS: 500.0000 mg | ORAL_CAPSULE | Freq: Four times a day (QID) | ORAL | 0 refills | Status: DC

## 2016-08-16 MED ORDER — CEPHALEXIN 500 MG PO CAPS: 500.0000 mg | ORAL_CAPSULE | Freq: Three times a day (TID) | ORAL | 0 refills | Status: DC

## 2016-08-16 MED ORDER — SODIUM CHLORIDE 0.9 % IR SOLN
Status: DC | PRN
  Administered 2016-08-16: 3000 mL

## 2016-08-16 MED ORDER — CEFAZOLIN SODIUM-DEXTROSE 2-4 GM/100ML-% IV SOLN
INTRAVENOUS | Status: AC
  Filled 2016-08-16: qty 100

## 2016-08-16 MED ORDER — FENTANYL CITRATE (PF) 100 MCG/2ML IJ SOLN
INTRAMUSCULAR | Status: AC
  Filled 2016-08-16: qty 2

## 2016-08-16 MED ORDER — SUCCINYLCHOLINE CHLORIDE 200 MG/10ML IV SOSY
PREFILLED_SYRINGE | INTRAVENOUS | Status: DC | PRN
  Administered 2016-08-16: 100 mg via INTRAVENOUS

## 2016-08-16 MED ORDER — ONDANSETRON HCL 4 MG/2ML IJ SOLN
INTRAMUSCULAR | Status: DC | PRN
  Administered 2016-08-16: 4 mg via INTRAVENOUS

## 2016-08-16 MED ORDER — LACTATED RINGERS IV SOLN
INTRAVENOUS | Status: DC | PRN
  Administered 2016-08-16: 18:00:00 via INTRAVENOUS

## 2016-08-16 MED ORDER — ONDANSETRON HCL 4 MG/2ML IJ SOLN: 4.0000 mg | Freq: Once | INTRAMUSCULAR | Status: DC | PRN

## 2016-08-16 SURGICAL SUPPLY — 12 items
BAG URO CATCHER STRL LF (MISCELLANEOUS) ×3 IMPLANT
CATH INTERMIT  6FR 70CM (CATHETERS) ×2 IMPLANT
CLOTH BEACON ORANGE TIMEOUT ST (SAFETY) ×3 IMPLANT
GLOVE BIO SURGEON STRL SZ7.5 (GLOVE) ×3 IMPLANT
GOWN STRL REUS W/TWL LRG LVL3 (GOWN DISPOSABLE) ×6 IMPLANT
GUIDEWIRE ANG ZIPWIRE 038X150 (WIRE) IMPLANT
GUIDEWIRE STR DUAL SENSOR (WIRE) ×2 IMPLANT
MANIFOLD NEPTUNE II (INSTRUMENTS) ×3 IMPLANT
PACK CYSTO (CUSTOM PROCEDURE TRAY) ×3 IMPLANT
STENT CONTOUR 6FRX26X.038 (STENTS) ×2 IMPLANT
TUBING CONNECTING 10 (TUBING) ×2 IMPLANT
TUBING CONNECTING 10' (TUBING) ×1

## 2016-08-16 NOTE — Anesthesia Preprocedure Evaluation (Signed)
Anesthesia Evaluation  Patient identified by MRN, date of birth, ID band Patient awake    Reviewed: Allergy & Precautions, Patient's Chart, lab work & pertinent test results  History of Anesthesia Complications (+) PONV  Airway Mallampati: I  TM Distance: >3 FB Neck ROM: Full    Dental   Pulmonary    Pulmonary exam normal        Cardiovascular hypertension, Pt. on medications Normal cardiovascular exam     Neuro/Psych    GI/Hepatic GERD  Medicated and Controlled,  Endo/Other    Renal/GU      Musculoskeletal   Abdominal   Peds  Hematology   Anesthesia Other Findings   Reproductive/Obstetrics                             Anesthesia Physical Anesthesia Plan  ASA: II and emergent  Anesthesia Plan: General   Post-op Pain Management:    Induction: Intravenous, Rapid sequence and Cricoid pressure planned  Airway Management Planned: Oral ETT  Additional Equipment:   Intra-op Plan:   Post-operative Plan: Extubation in OR  Informed Consent: I have reviewed the patients History and Physical, chart, labs and discussed the procedure including the risks, benefits and alternatives for the proposed anesthesia with the patient or authorized representative who has indicated his/her understanding and acceptance.     Plan Discussed with: CRNA and Surgeon  Anesthesia Plan Comments:         Anesthesia Quick Evaluation

## 2016-08-16 NOTE — ED Triage Notes (Addendum)
Pt c/o kidney stones. State she had a CT 2 weeks ago stating she had multiple kidney stones. Starting this morning pt has not been able to unrinate but a few drops. Pt is a lot of pain, pacing the room and agitated. Pt states she took Toradol and Vicodin for pain this am with no relief. Pt offered medication through severe pain protocol and pt refused.

## 2016-08-16 NOTE — Op Note (Signed)
Date of procedure: 08/16/16  Preoperative diagnosis:  1. Left ureteral stones  2. Right nonobstructing renal stone  Postoperative diagnosis:  1. Left ureteral stones 2. Right nonobstructive renal stone 3. Bladder tumor   Procedure: 1. Cystoscopy 2. Left retrograde pyelogram with interpretation 3. Left ureteral stent placement 6 French by 26 cm  Surgeon: Baruch Gouty, MD  Anesthesia: General  Complications: None  Intraoperative findings: The patient had significant hydroureteronephrosis proximal to her distal left ureteral stones. There is significant drainage of clear yellow urine after stent placement. There is also an incidental finding of a possible bladder tumor just medial to the left ureteral orifice. It was less than a centimeter in size. It is unclear if it was inflammation versus very early tumor.  EBL: None  Specimens: None  Drains: 6 French by 26 cm left double-J ureteral stent  Disposition: Stable to the postanesthesia care unit  Indication for procedure: The patient is a 55 y.o. female with intractable pain from left ureteral stones and mild leukocytosis. Decision was made to place a left ureteral stent for her pain without addressing her ureteral stone burden due to the mild leukocytosis..  After reviewing the management options for treatment, the patient elected to proceed with the above surgical procedure(s). We have discussed the potential benefits and risks of the procedure, side effects of the proposed treatment, the likelihood of the patient achieving the goals of the procedure, and any potential problems that might occur during the procedure or recuperation. Informed consent has been obtained.  Description of procedure: The patient was met in the preoperative area. All risks, benefits, and indications of the procedure were described in great detail. The patient consented to the procedure. Preoperative antibiotics were given. The patient was taken to the  operative theater. General anesthesia was induced per the anesthesia service. The patient was then placed in the dorsal lithotomy position and prepped and draped in the usual sterile fashion. A preoperative timeout was called.   A 21 French 30 cystoscope was inserted into the patient's bladder per urethra atraumatically. The left ureteral orifice was visualized just medial to this was an area of inflammation versus a small tumor. Decision was made not to address this tumor at this time though that she will become back to the operating room after undergoing a negative culture sent was felt best would be safer that time. A retrograde pyelogram was obtained with findings as above. Sensor wire was then placed the level of the left renal pelvis under fluoroscopy. A 6 French by 26 under double-J ureteral stent was in place. Sensor wire was removed. A curl seen in the renal pelvis on fluoroscopy and in the urinary bladder direct visualization. There is a married medial return of large amounts of clear yellow urine. The patient's bladder was then drained. She was woke from anesthesia and transferred in stable condition to the post anesthesia care unit.  Plan:  The patient will follow-up with her primary urologist Dr. Ma Rings in one week to discuss definitive stone management. If that very small tumor is still present at that time, she would benefit from a TURBT versus bladder biopsy at that time as well. Her nonobstructing right renal calculus will be addressed at a later time.  Baruch Gouty, M.D.

## 2016-08-16 NOTE — Anesthesia Procedure Notes (Signed)
Procedure Name: Intubation Date/Time: 08/16/2016 6:29 PM Performed by: Lind Covert Pre-anesthesia Checklist: Emergency Drugs available, Patient identified, Suction available, Patient being monitored and Timeout performed Patient Re-evaluated:Patient Re-evaluated prior to inductionOxygen Delivery Method: Circle system utilized Intubation Type: IV induction, Rapid sequence and Cricoid Pressure applied Laryngoscope Size: Mac and 4 Grade View: Grade II Tube size: 7.0 mm Number of attempts: 1 Airway Equipment and Method: Stylet Placement Confirmation: ETT inserted through vocal cords under direct vision,  positive ETCO2 and breath sounds checked- equal and bilateral Secured at: 20 cm Tube secured with: Tape Dental Injury: Teeth and Oropharynx as per pre-operative assessment

## 2016-08-16 NOTE — Transfer of Care (Signed)
Immediate Anesthesia Transfer of Care Note  Patient: Amanda Willis  Procedure(s) Performed: Procedure(s): CYSTOSCOPY WITH RETROGRADE PYELOGRAM/LEFT URETERAL STENT PLACEMENT (Left)  Patient Location: PACU  Anesthesia Type:General  Level of Consciousness: sedated  Airway & Oxygen Therapy: Patient Spontanous Breathing and Patient connected to face mask oxygen  Post-op Assessment: Report given to RN and Post -op Vital signs reviewed and stable  Post vital signs: Reviewed and stable  Last Vitals:  Vitals:   08/16/16 1721 08/16/16 1800  BP: 156/89 151/82  Pulse: 86 79  Resp: 17 16  Temp:      Last Pain:  Vitals:   08/16/16 1742  TempSrc:   PainSc: 3          Complications: No apparent anesthesia complications

## 2016-08-16 NOTE — ED Provider Notes (Signed)
[facility] DEPT Provider Note   CSN: [id number] Arrival date & time: 08/16/16  1255     History   Chief Complaint Chief Complaint  Patient presents with  . Flank Pain    HPI Amanda Willis is a 55 y.o. female.  HPI Pt with onset of left flank and left sided abdominal pain today. Colicky. Feels like a kidney stone. Pain is severe. Associated nausea and vomiting. No diarrhea. No dysuria or fever.    Past Medical History:  Diagnosis Date  . Family history of adverse reaction to anesthesia    "Dad was very hard to wake up"  . Fatty liver 05/2012   noted on ultrasound.   Marland Kitchen GERD (gastroesophageal reflux disease)   . H/O hiatal hernia   . High cholesterol   . History of blood transfusion ~ 1984   "related to hysteretomy"  . Hypertension   . Migraine    "~ q 3 months" (11/01/2014)  . Neuromuscular disorder ([place])    poss. MS- no def dx  . PONV (postoperative nausea and vomiting)    "hard to wake up"  . Shortness of breath    due to wt gain    Patient Active Problem List   Diagnosis Date Noted  . Abdominal pain 11/01/2014    Past Surgical History:  Procedure Laterality Date  . ANTERIOR AND POSTERIOR REPAIR N/A 03/15/2013   Procedure: POSTERIOR REPAIR;  Surgeon: Cyril Mourning, MD;  Location: [facility];  Service: Gynecology;  Laterality: N/A;  . ANTERIOR CERVICAL DECOMP/DISCECTOMY FUSION  04/2008  . BACK SURGERY    . PARTIAL NEPHRECTOMY Right 1986  . POSTERIOR FUSION CERVICAL SPINE  10/2009  . RECTOCELE REPAIR  03/2007   Dr Helane Rima.  posterior repair.   Marland Kitchen REDUCTION MAMMAPLASTY Bilateral 1992  . TONSILLECTOMY    . TOTAL ABDOMINAL HYSTERECTOMY  ~ 1984    OB History    No data available       Home Medications    Prior to Admission medications   Medication Sig Start Date End Date Taking? Authorizing Provider  aspirin EC 81 MG tablet Take 81 mg by mouth daily.   Yes Historical Provider, MD  docusate sodium 100 MG CAPS Take 100 mg by mouth daily. 11/05/14  Yes  Velna Hatchet, MD  esomeprazole (NEXIUM) 40 MG capsule Take 40 mg by mouth daily before breakfast.   Yes Historical Provider, MD  fluticasone (FLONASE) 50 MCG/ACT nasal spray Place 2 sprays into both nostrils daily as needed for rhinitis or allergies.    Yes Historical Provider, MD  gabapentin (NEURONTIN) 300 MG capsule Take 300 mg by mouth 3 (three) times daily. 07/17/16  Yes Historical Provider, MD  hydrochlorothiazide (HYDRODIURIL) 50 MG tablet Take 50 mg by mouth daily.   Yes Historical Provider, MD  HYDROcodone-acetaminophen (NORCO) 10-325 MG tablet Take 0.5-1 tablets by mouth 3 (three) times daily as needed for pain. 07/28/16  Yes Historical Provider, MD  levocetirizine (XYZAL) 5 MG tablet Take 5 mg by mouth daily. 06/07/16  Yes Historical Provider, MD  lisinopril (PRINIVIL,ZESTRIL) 10 MG tablet Take 10 mg by mouth daily. 06/07/16  Yes Historical Provider, MD  metoprolol succinate (TOPROL-XL) 50 MG 24 hr tablet Take 50 mg by mouth daily. Take with or immediately following a meal.   Yes Historical Provider, MD  saccharomyces boulardii (FLORASTOR) 250 MG capsule Take 1 capsule (250 mg total) by mouth 2 (two) times daily. Patient not taking: Reported on 08/16/2016 11/05/14   Velna Hatchet, MD  Family History No family history on file.  Social History Social History  Substance Use Topics  . Smoking status: Never Smoker  . Smokeless tobacco: Never Used  . Alcohol use No     Allergies   Morphine and related and Reglan [metoclopramide]   Review of Systems Review of Systems  All other systems reviewed and are negative.    Physical Exam Updated Vital Signs BP 167/95   Pulse 93   Temp 98 F (36.7 C) (Oral)   Resp 20   SpO2 98%   Physical Exam  Constitutional: She is oriented to person, place, and time. She appears well-developed.  Uncomfortable appearing  HENT:  Head: Normocephalic and atraumatic.  Eyes: EOM are normal.  Neck: Normal range of motion.  Cardiovascular:  Normal rate, regular rhythm and normal heart sounds.   Pulmonary/Chest: Effort normal and breath sounds normal.  Abdominal: Soft. She exhibits no distension. There is no tenderness.  Musculoskeletal: Normal range of motion.  Neurological: She is alert and oriented to person, place, and time.  Skin: Skin is warm and dry.  Psychiatric: She has a normal mood and affect. Judgment normal.  Nursing note and vitals reviewed.    ED Treatments / Results  Labs (all labs ordered are listed, but only abnormal results are displayed) Labs Reviewed  URINALYSIS, ROUTINE W REFLEX MICROSCOPIC (NOT AT [facility]) - Abnormal; Notable for the following:       Result Value   APPearance TURBID (*)    Hgb urine dipstick LARGE (*)    Protein, ur 30 (*)    Leukocytes, UA TRACE (*)    All other components within normal limits  CBC WITH DIFFERENTIAL/PLATELET - Abnormal; Notable for the following:    WBC 13.8 (*)    RBC 5.14 (*)    Hemoglobin 15.4 (*)    Lymphs Abs 5.3 (*)    All other components within normal limits  BASIC METABOLIC PANEL - Abnormal; Notable for the following:    Potassium 3.3 (*)    Chloride 100 (*)    Glucose, Bld 157 (*)    BUN 29 (*)    All other components within normal limits  URINE MICROSCOPIC-ADD ON - Abnormal; Notable for the following:    Squamous Epithelial / LPF 6-30 (*)    Bacteria, UA MANY (*)    All other components within normal limits  URINE CULTURE    EKG  EKG Interpretation None       Radiology Ct Renal Stone Study  Result Date: 08/16/2016 CLINICAL DATA:  Painful urination.  Left flank pain. EXAM: CT ABDOMEN AND PELVIS WITHOUT CONTRAST TECHNIQUE: Multidetector CT imaging of the abdomen and pelvis was performed following the standard protocol without IV contrast. COMPARISON:  07/28/2016 FINDINGS: Lower chest: No acute abnormality. Hepatobiliary: No focal liver abnormality is seen. No gallstones, gallbladder wall thickening, or biliary dilatation. Pancreas:  Unremarkable. No pancreatic ductal dilatation or surrounding inflammatory changes. Spleen: Normal in size without focal abnormality. Adrenals/Urinary Tract: Adrenal glands are unremarkable. 11 mm nonobstructing right renal calculus. No right obstructive uropathy. Two 7 mm left UPJ calculi resulting in moderate left hydroureteronephrosis. Decompressed bladder. Stomach/Bowel: Stomach is within normal limits. No evidence of bowel wall thickening, distention, or inflammatory changes. Vascular/Lymphatic: Abdominal aorta is normal in caliber with mild atherosclerosis. No enlarged abdominal or pelvic lymph nodes. Reproductive: Status post hysterectomy. No adnexal masses. Other: Small fat containing umbilical hernia. No abdominopelvic ascites. Musculoskeletal: No acute osseous abnormality. No lytic or sclerotic osseous lesion. Degenerative disc  disease with disc height loss at L5-S1 with bilateral facet arthropathy. IMPRESSION: 1. Two 7 mm left UPJ calculi resulting in moderate left hydroureteronephrosis. 2. 11 mm nonobstructing right renal calculus. Electronically Signed   By: Kathreen Devoid   On: 08/16/2016 14:48    Procedures Procedures (including critical care time)  Medications Ordered in ED Medications  morphine 4 MG/ML injection 6 mg (not administered)  ketorolac (TORADOL) 30 MG/ML injection 30 mg (30 mg Intravenous Given 08/16/16 1346)  HYDROmorphone (DILAUDID) injection 1 mg (1 mg Intravenous Given 08/16/16 1351)  ondansetron (ZOFRAN) injection 4 mg (4 mg Intravenous Given 08/16/16 1348)  HYDROmorphone (DILAUDID) injection 1 mg (1 mg Intravenous Given 08/16/16 1621)  sodium chloride 0.9 % bolus 1,000 mL (0 mLs Intravenous Stopped 08/16/16 1626)     Initial Impression / Assessment and Plan / ED Course  I have reviewed the triage vital signs and the nursing notes.  Pertinent labs & imaging results that were available during my care of the patient were reviewed by me and considered in my medical decision  making (see chart for details).  Clinical Course    4:52 PM Ongoing and unrelenting pain. Additional pain medicine given. Will contact urology for possible stent for pain control.   Final Clinical Impressions(s) / ED Diagnoses   Final diagnoses:  None    New Prescriptions New Prescriptions   No medications on file     Jola Schmidt, MD 08/16/16 1653

## 2016-08-16 NOTE — Progress Notes (Signed)
PACU Nsg Note - Discharge Note: Pt and husband dc teaching done, pt very alert and oriented, denies any pain, able to ambulate to restroom w/ min assistance, voided, tolerated PO well. DC instructions provided with DC sheet along with DC anesthesia sheet, Cysto Sheet and Stent Placement sheet. Instructions provided re: (1) importance of handwashing (2) importance of making and keeping follow up MD appointment as per surgeon (3) discussed safety while taking PO narcotics for pain (4) discussed taking abx as prescribed and to complete (5) reviewed anesthesia sheet for diet, activity, rest etc post op (6) also provided information in re: as to when to call MD, I.e. Fever, uncontrolled pain, unable to void Husband and pt both verbalized understanding of dc instructions and was provided many opportunities for questions or clarification of the above instructions. Pt escorted to pvt vehicle via w/c with PACU RN

## 2016-08-16 NOTE — H&P (Signed)
6:16 PM   Amanda Willis 12/07/1960 [id number]  Referring provider: Dr. Marylene Buerger  Chief Complaint  Patient presents with  . Flank Pain    HPI: The patient is a 55 year old female with a past medical history of nephrolithiasis presented to the hospital with intractable left flank pain. She is to approximate 5 mm stones in the distal left ureter with left hydronephrosis. She also has a nonobstructing right renal stone. Her pain remains intractable despite IV narcotics. Her white blood cell count slightly elevated at approximately 13.    urology was consulted for further management.   PMH: Past Medical History:  Diagnosis Date  . Family history of adverse reaction to anesthesia    "Dad was very hard to wake up"  . Fatty liver 05/2012   noted on ultrasound.   Marland Kitchen GERD (gastroesophageal reflux disease)   . H/O hiatal hernia   . High cholesterol   . History of blood transfusion ~ 1984   "related to hysteretomy"  . Hypertension   . Migraine    "~ q 3 months" (11/01/2014)  . Neuromuscular disorder ([place])    poss. MS- no def dx  . PONV (postoperative nausea and vomiting)    "hard to wake up"  . Shortness of breath    due to wt gain    Surgical History: Past Surgical History:  Procedure Laterality Date  . ANTERIOR AND POSTERIOR REPAIR N/A 03/15/2013   Procedure: POSTERIOR REPAIR;  Surgeon: Cyril Mourning, MD;  Location: [facility];  Service: Gynecology;  Laterality: N/A;  . ANTERIOR CERVICAL DECOMP/DISCECTOMY FUSION  04/2008  . BACK SURGERY    . PARTIAL NEPHRECTOMY Right 1986  . POSTERIOR FUSION CERVICAL SPINE  10/2009  . RECTOCELE REPAIR  03/2007   Dr Helane Rima.  posterior repair.   Marland Kitchen REDUCTION MAMMAPLASTY Bilateral 1992  . TONSILLECTOMY    . TOTAL ABDOMINAL HYSTERECTOMY  ~ 1984    Home Medications:    Allergies:  Allergies  Allergen Reactions  . Morphine And Related Shortness Of Breath, Itching and Rash  . Reglan [Metoclopramide] Anxiety    Family History: History  reviewed. No pertinent family history.  Social History:  reports that she has never smoked. She has never used smokeless tobacco. She reports that she does not drink alcohol or use drugs.  ROS: 12 point ROS negative except per HPI                                        Physical Exam: BP 151/82   Pulse 79   Temp 98 F (36.7 C) (Oral)   Resp 16   SpO2 92%   Constitutional:  Alert and oriented, No acute distress. HEENT: [place] AT, moist mucus membranes.  Trachea midline, no masses. Cardiovascular: No clubbing, cyanosis, or edema. Respiratory: Normal respiratory effort, no increased work of breathing. GI: Abdomen is soft, nontender, nondistended, no abdominal masses GU: Left CVA tenderness.  Skin: No rashes, bruises or suspicious lesions. Lymph: No cervical or inguinal adenopathy. Neurologic: Grossly intact, no focal deficits, moving all 4 extremities. Psychiatric: Normal mood and affect.  Laboratory Data: Lab Results  Component Value Date   WBC 13.8 (H) 08/16/2016   HGB 15.4 (H) 08/16/2016   HCT 45.7 08/16/2016   MCV 88.9 08/16/2016   PLT 292 08/16/2016    Lab Results  Component Value Date   CREATININE 0.87 08/16/2016    No  results found for: PSA  No results found for: TESTOSTERONE  No results found for: HGBA1C  Urinalysis    Component Value Date/Time   COLORURINE YELLOW 08/16/2016 1319   APPEARANCEUR TURBID (A) 08/16/2016 1319   LABSPEC 1.030 08/16/2016 1319   PHURINE 6.0 08/16/2016 1319   GLUCOSEU NEGATIVE 08/16/2016 1319   HGBUR LARGE (A) 08/16/2016 1319   BILIRUBINUR NEGATIVE 08/16/2016 1319   KETONESUR NEGATIVE 08/16/2016 1319   PROTEINUR 30 (A) 08/16/2016 1319   UROBILINOGEN 1.0 10/31/2014 0506   NITRITE NEGATIVE 08/16/2016 1319   LEUKOCYTESUR TRACE (A) 08/16/2016 1319    Pertinent Imaging: CT scan reviewed as above  Assessment & Plan:    1. Left ureteral stones 2. Left hydronephrosis 3. Nonobstructing right ureteral  stone I discussed the patient cystoscopy, left retrograde pyelogram, left ureteral stent placement for intractable renal colic. We discussed that we would not address the stones at this time due to her leukocytosis, though otherwise of low suspicion for urinary tract infection though. She wishes to goes place a stent to relieve the obstruction at the stones are causing. She understands she'll need further surgery for definitive stone management. All risks, benefits, indications procedure described in great detail. She understands these risks include rather limited to bleeding, infection, need for repeat procedures, iatrogenic injury, and need for percutaneous nephrostomy tube with stent placement were to fail. All questions were answered. The patient has elected to proceed.    Nickie Retort, MD

## 2016-08-16 NOTE — Anesthesia Postprocedure Evaluation (Signed)
Anesthesia Post Note  Patient: Amanda Willis  Procedure(s) Performed: Procedure(s) (LRB): CYSTOSCOPY WITH RETROGRADE PYELOGRAM/LEFT URETERAL STENT PLACEMENT (Left)  Patient location during evaluation: PACU Anesthesia Type: General Level of consciousness: awake and alert Pain management: pain level controlled Vital Signs Assessment: post-procedure vital signs reviewed and stable Respiratory status: spontaneous breathing, nonlabored ventilation, respiratory function stable and patient connected to nasal cannula oxygen Cardiovascular status: blood pressure returned to baseline and stable Postop Assessment: no signs of nausea or vomiting Anesthetic complications: no    Last Vitals:  Vitals:   08/16/16 1800 08/16/16 1900  BP: 151/82   Pulse: 79   Resp: 16   Temp:  36.5 C    Last Pain:  Vitals:   08/16/16 1742  TempSrc:   PainSc: 3                  Jex Strausbaugh DAVID

## 2016-08-17 ENCOUNTER — Encounter (HOSPITAL_COMMUNITY): Payer: Self-pay | Admitting: Urology

## 2016-08-17 LAB — URINE CULTURE

## 2016-08-27 ENCOUNTER — Encounter (HOSPITAL_BASED_OUTPATIENT_CLINIC_OR_DEPARTMENT_OTHER): Payer: Self-pay | Admitting: *Deleted

## 2016-08-27 ENCOUNTER — Other Ambulatory Visit: Payer: Self-pay | Admitting: Urology

## 2016-08-27 NOTE — Progress Notes (Signed)
NPO AFTER MN.  ARRIVE AT 1015.  NEEDS KUB.  CURRENT LAB RESULTS AND EKG IN CHART AND EPIC.  WILL TAKE AM MEDS DOS W/ SIPS OF WATER WITH EXCEPTION NO HCTZ.

## 2016-08-29 ENCOUNTER — Ambulatory Visit (HOSPITAL_BASED_OUTPATIENT_CLINIC_OR_DEPARTMENT_OTHER): Payer: Self-pay | Admitting: Urology

## 2016-08-29 NOTE — H&P (Signed)
HPI: Amanda Willis is a 55 year-old female with left ureteral calculi.  The pain is on the left side. This is her first kidney stone.   She has had Ureteral Stent for treatment of her stones in the past.   She reported having passed 3 stones in 4/17 and 5/17. She also indicated that she was told she had a UTI with her stones.  In 1984 she underwent a right partial nephrectomy for what sounds like some form of stones with possible infection.   Due to intractable pain she underwent cystoscopy with left double-J stent placement on 08/16/16. At the time of surgery she was noted to have what was felt to possibly be a bladder tumor versus inflammation in the area of the left ureteral orifice.   She reports to me that she continues to have bladder pain due to her stent and is also continuing to have some hematuria. She has been taking Myrbetriq and Pyridium but these do not seem to be helping.     ALLERGIES: morphine - "respiration/depression Reglan - anxiety    MEDICATIONS: Hydrochlorothiazide 50 mg tablet  Hydrocodone-Acetaminophen 5 mg-300 mg tablet 1-2 tablet PO Q 4 H  Ketorolac Tromethamine 10 mg tablet 1 tablet PO Q 6 H  Lisinopril 10 mg tablet  Nexium 40 mg capsule,delayed release  Oxycodone Hcl 10 mg tablet 1-2 tablet PO Q 4 H  Aspir 81  Colace 100 mg capsule  Gabapentin 300 mg capsule  Levocetirizine Dihydrochloride 5 mg tablet  Meloxicam 15 mg tablet     GU PSH: Cystocele Repair Cystoscopy - 08/03/2016 Cystoscopy Insert Stent, Left - 08/16/2016 Locm 300-399Mg /Ml Iodine,1Ml - 07/28/2016 Partial Nephrectomy, Right - 1984      PSH Notes: bilateral salpingo-oophorectomy   NON-GU PSH: Back Surgery (Unspecified) Hysterectomy, Radical    GU PMH: Calculus Ureter (Stable), Left, She has left distal ureteral calculi That are not causing any obstruction. What I have recommended is a trial of medical expulsive therapy. - 08/03/2016, Calculus Ureter (Acute), Left, Several stones were noted  in the distal left ureter without evidence of obstruction. - 07/28/2016 Gross hematuria (Stable), Cystoscopically no abnormality was noted within the bladder. It appears her gross hematuria secondary to her left distal ureteral calculi. - 08/03/2016, Although the passage of a kidney stone is the most likely cause of her gross hematuria we discussed the fact that she has been a smoker and my recommendation is a full hematuria workup which would also allow me to evaluate for the presence of calculus disease., - 07/22/2016 Kidney Stone, Right, She has a nonobstructing stone in the right kidney - 08/03/2016, Right, She has a history of having passed stones over the past year and also having had a right partial nephrectomy for kidney stones in the 1980s., - 07/22/2016      PMH Notes: CT scan 07/28/16:10 mm stone in the lower pole of the right kidney with Hounsfield units of approximately 1000 and no left renal calculi. At that time there were several small stones in the distal left ureter that were not associated with any hydronephrosis/obstruction.   NON-GU PMH: GERD Hypercholesterolemia Hypertension    FAMILY HISTORY: 2 daughters - Daughter Bladder Cancer - Father Hematuria - Father Kidney Stones - Father, Daughter Prostate Cancer - Father renal failure - Father   SOCIAL HISTORY: Marital Status: Married Current Smoking Status: Patient has never smoked.  Drinks 2 drinks per year. Social Drinker.  Drinks 2 caffeinated drinks per day. Patient's occupation is/was retired.  REVIEW OF SYSTEMS:    GU Review Female:   Patient reports frequent urination, burning /pain with urination, get up at night to urinate, and leakage of urine. Patient denies hard to postpone urination, stream starts and stops, trouble starting your stream, have to strain to urinate, and currently pregnant.  Gastrointestinal (Upper):   Patient reports indigestion/ heartburn.   Gastrointestinal (Lower):   Patient reports constipation.  Patient denies diarrhea.  Constitutional:   Patient reports night sweats, weight loss, and fatigue. Patient denies fever.  Skin:   Patient denies skin rash/ lesion and itching.  Eyes:   Patient denies blurred vision and double vision.  Ears/ Nose/ Throat:   Patient denies sore throat and sinus problems.  Hematologic/Lymphatic:   Patient denies easy bruising and swollen glands.  Cardiovascular:   Patient denies leg swelling and chest pains.  Respiratory:   Patient reports cough and shortness of breath.   Endocrine:   Patient denies excessive thirst.  Musculoskeletal:   Patient reports back pain. Patient denies joint pain.  Neurological:   Patient reports dizziness. Patient denies headaches.  Psychologic:   Patient denies depression and anxiety.   VITAL SIGNS:       Weight 219 lb / 99.34 kg  Height 67 in / 170.18 cm  BP 141/85 mmHg  Pulse 93 /min  Temperature 97.2 F / 36 C  BMI 34.3 kg/m  MULTI-SYSTEM PHYSICAL EXAMINATION:    Constitutional: Well-nourished. No physical deformities. Normally developed. Good grooming.  Neck: Neck symmetrical, not swollen. Normal tracheal position.  Respiratory: No labored breathing, no use of accessory muscles.   Cardiovascular: Normal temperature, normal extremity pulses, no swelling, no varicosities.  Lymphatic: No enlargement of neck, axillae, groin.  Skin: No paleness, no jaundice, no cyanosis. No lesion, no ulcer, no rash.  Neurologic / Psychiatric: Oriented to time, oriented to place, oriented to person. No depression, no anxiety, no agitation.  Gastrointestinal: No mass, no tenderness, no rigidity, non obese abdomen.  Eyes: Normal conjunctivae. Normal eyelids.  Ears, Nose, Mouth, and Throat: Left ear no scars, no lesions, no masses. Right ear no scars, no lesions, no masses. Nose no scars, no lesions, no masses. Normal hearing. Normal lips.  Musculoskeletal: Right CVA tenderness spine, ribs, pelvis. Normal gait and station of head and neck.      PAST DATA REVIEWED:  Source Of History:  Patient  Records Review:   Previous Hospital Records, Previous Patient Records   PROCEDURES:         KUB A single view of the abdomen was obtained.      Her stent is in good position. There are stones seen adjacent to the stent in the distal ureter.         Urinalysis w/Scope - 81001 Dipstick Dipstick Cont'd Micro  Specimen: Voided Bilirubin: Neg WBC/hpf: 0-5/hpf  Color: Red Ketones: Trace RBC/hpf: >60/hpf  Appearance: Cloudy Blood: 3+ Bacteria: Rare  Specific Gravity: >= 1.030 Protein: 3+ Cystals: NS (Not Seen)  pH: 5.5 Urobilinogen: 1.0 Casts: NS (Not Seen)  Glucose: Neg Nitrites: Invalid Trichomonas: Not Present    Leukocyte Esterase: 3+ Mucous: Not Present      Epithelial Cells: 0-5/hpf      Yeast: NS (Not Seen)      Sperm: Not Present    ASSESSMENT:   1 GU:   Calculus Ureter - N20.1 Stable - We discussed the treatment options for her stones. It was not felt that her stones were of a size that she could pass them spontaneously  if her stent was removed so we therefore have discussed ureteroscopy as the best form of therapy for these particular calculi. I discussed the removal of her stent and ureteroscopy with laser lithotripsy and possible replacement of a stent with her in detail. We discussed the risks and complications, the probability of success as well as the outpatient nature and anticipated postoperative course. She understands and is elected to proceed.  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    She continues to have bladder irritation likely from her stent so I have given her samples of Vesicare 10 mg and also some Uribel samples    PLAN: Cystoscopy, left ureteral stent removal with retrograde pyelogram and stone extraction with laser lithotripsy.  I will also assess her possible bladder neoplasm.

## 2016-08-30 ENCOUNTER — Ambulatory Visit (HOSPITAL_BASED_OUTPATIENT_CLINIC_OR_DEPARTMENT_OTHER): Payer: Medicare Other | Admitting: Anesthesiology

## 2016-08-30 ENCOUNTER — Ambulatory Visit (HOSPITAL_COMMUNITY): Payer: Medicare Other

## 2016-08-30 ENCOUNTER — Encounter (HOSPITAL_BASED_OUTPATIENT_CLINIC_OR_DEPARTMENT_OTHER): Payer: Self-pay | Admitting: *Deleted

## 2016-08-30 ENCOUNTER — Encounter (HOSPITAL_BASED_OUTPATIENT_CLINIC_OR_DEPARTMENT_OTHER)
Admission: RE | Disposition: A | Discharge status: Home / Self Care | Payer: Self-pay | Source: Ambulatory Visit | Attending: Urology

## 2016-08-30 ENCOUNTER — Ambulatory Visit (HOSPITAL_BASED_OUTPATIENT_CLINIC_OR_DEPARTMENT_OTHER)
Admission: RE | Admit: 2016-08-30 | Discharge: 2016-08-30 | Disposition: A | Discharge status: Home / Self Care | Payer: Medicare Other | Source: Ambulatory Visit | Attending: Urology | Admitting: Urology

## 2016-08-30 DIAGNOSIS — Z791 Long term (current) use of non-steroidal anti-inflammatories (NSAID): Secondary | ICD-10-CM | POA: Diagnosis not present

## 2016-08-30 DIAGNOSIS — Z79899 Other long term (current) drug therapy: Secondary | ICD-10-CM | POA: Diagnosis not present

## 2016-08-30 DIAGNOSIS — Z7982 Long term (current) use of aspirin: Secondary | ICD-10-CM | POA: Insufficient documentation

## 2016-08-30 DIAGNOSIS — K219 Gastro-esophageal reflux disease without esophagitis: Secondary | ICD-10-CM | POA: Diagnosis not present

## 2016-08-30 DIAGNOSIS — Z8042 Family history of malignant neoplasm of prostate: Secondary | ICD-10-CM | POA: Diagnosis not present

## 2016-08-30 DIAGNOSIS — Z905 Acquired absence of kidney: Secondary | ICD-10-CM | POA: Insufficient documentation

## 2016-08-30 DIAGNOSIS — N201 Calculus of ureter: Secondary | ICD-10-CM | POA: Diagnosis not present

## 2016-08-30 DIAGNOSIS — D414 Neoplasm of uncertain behavior of bladder: Secondary | ICD-10-CM | POA: Diagnosis not present

## 2016-08-30 DIAGNOSIS — I10 Essential (primary) hypertension: Secondary | ICD-10-CM | POA: Insufficient documentation

## 2016-08-30 DIAGNOSIS — Z841 Family history of disorders of kidney and ureter: Secondary | ICD-10-CM | POA: Insufficient documentation

## 2016-08-30 DIAGNOSIS — Z8052 Family history of malignant neoplasm of bladder: Secondary | ICD-10-CM | POA: Diagnosis not present

## 2016-08-30 HISTORY — DX: Diaphragmatic hernia without obstruction or gangrene: K44.9

## 2016-08-30 HISTORY — DX: Calculus of kidney: N20.0

## 2016-08-30 HISTORY — DX: Polyneuropathy, unspecified: G62.9

## 2016-08-30 HISTORY — PX: CYSTOSCOPY WITH RETROGRADE PYELOGRAM, URETEROSCOPY AND STENT PLACEMENT: SHX5789

## 2016-08-30 HISTORY — DX: Hyperlipidemia, unspecified: E78.5

## 2016-08-30 HISTORY — DX: Personal history of urinary calculi: Z87.442

## 2016-08-30 HISTORY — DX: Personal history of other benign neoplasm: Z86.018

## 2016-08-30 HISTORY — DX: Urgency of urination: R39.15

## 2016-08-30 HISTORY — PX: STONE EXTRACTION WITH BASKET: SHX5318

## 2016-08-30 HISTORY — DX: Presence of spectacles and contact lenses: Z97.3

## 2016-08-30 HISTORY — DX: Left anterior fascicular block: I44.4

## 2016-08-30 SURGERY — CYSTOURETEROSCOPY, WITH RETROGRADE PYELOGRAM AND STENT INSERTION
Anesthesia: General | Site: Ureter | Laterality: Left

## 2016-08-30 MED ORDER — DEXAMETHASONE SODIUM PHOSPHATE 4 MG/ML IJ SOLN
INTRAMUSCULAR | Status: DC | PRN
  Administered 2016-08-30: 10 mg via INTRAVENOUS

## 2016-08-30 MED ORDER — FENTANYL CITRATE (PF) 100 MCG/2ML IJ SOLN
INTRAMUSCULAR | Status: AC
  Filled 2016-08-30: qty 2

## 2016-08-30 MED ORDER — LIDOCAINE 2% (20 MG/ML) 5 ML SYRINGE
INTRAMUSCULAR | Status: AC
  Filled 2016-08-30: qty 5

## 2016-08-30 MED ORDER — PROPOFOL 10 MG/ML IV BOLUS
INTRAVENOUS | Status: DC | PRN
  Administered 2016-08-30: 200 mg via INTRAVENOUS

## 2016-08-30 MED ORDER — LIDOCAINE HCL (CARDIAC) 20 MG/ML IV SOLN
INTRAVENOUS | Status: DC | PRN
  Administered 2016-08-30: 100 mg via INTRAVENOUS

## 2016-08-30 MED ORDER — PROPOFOL 10 MG/ML IV BOLUS
INTRAVENOUS | Status: AC
  Filled 2016-08-30: qty 20

## 2016-08-30 MED ORDER — IOHEXOL 300 MG/ML  SOLN
INTRAMUSCULAR | Status: DC | PRN
Start: 2016-08-30 — End: 2016-08-30
  Administered 2016-08-30: 8 mL via URETHRAL

## 2016-08-30 MED ORDER — FENTANYL CITRATE (PF) 100 MCG/2ML IJ SOLN
INTRAMUSCULAR | Status: DC | PRN
  Administered 2016-08-30: 100 ug via INTRAVENOUS
  Administered 2016-08-30: 25 ug via INTRAVENOUS
  Administered 2016-08-30: 50 ug via INTRAVENOUS
  Administered 2016-08-30: 25 ug via INTRAVENOUS
  Administered 2016-08-30: 50 ug via INTRAVENOUS
  Administered 2016-08-30 (×2): 25 ug via INTRAVENOUS

## 2016-08-30 MED ORDER — OXYCODONE HCL 5 MG PO TABS
5.0000 mg | ORAL_TABLET | Freq: Once | ORAL | Status: AC | PRN
  Administered 2016-08-30: 5 mg via ORAL
  Filled 2016-08-30: qty 1

## 2016-08-30 MED ORDER — FENTANYL CITRATE (PF) 100 MCG/2ML IJ SOLN
25.0000 ug | INTRAMUSCULAR | Status: DC | PRN
  Filled 2016-08-30: qty 1

## 2016-08-30 MED ORDER — TRAMADOL HCL 50 MG PO TABS: 50.0000 mg | ORAL_TABLET | Freq: Four times a day (QID) | ORAL | 0 refills | Status: DC | PRN

## 2016-08-30 MED ORDER — PHENAZOPYRIDINE HCL 200 MG PO TABS: 200.0000 mg | ORAL_TABLET | Freq: Three times a day (TID) | ORAL | 0 refills | Status: DC | PRN

## 2016-08-30 MED ORDER — OXYCODONE HCL 5 MG PO TABS
ORAL_TABLET | ORAL | Status: AC
  Filled 2016-08-30: qty 1

## 2016-08-30 MED ORDER — MIDAZOLAM HCL 5 MG/5ML IJ SOLN
INTRAMUSCULAR | Status: DC | PRN
  Administered 2016-08-30: 2 mg via INTRAVENOUS

## 2016-08-30 MED ORDER — LACTATED RINGERS IV SOLN
INTRAVENOUS | Status: DC
  Administered 2016-08-30 (×3): via INTRAVENOUS
  Filled 2016-08-30: qty 1000

## 2016-08-30 MED ORDER — ONDANSETRON HCL 4 MG/2ML IJ SOLN
INTRAMUSCULAR | Status: DC | PRN
  Administered 2016-08-30 (×2): 4 mg via INTRAVENOUS

## 2016-08-30 MED ORDER — CIPROFLOXACIN IN D5W 400 MG/200ML IV SOLN
INTRAVENOUS | Status: AC
  Filled 2016-08-30: qty 200

## 2016-08-30 MED ORDER — EPHEDRINE SULFATE 50 MG/ML IJ SOLN
INTRAMUSCULAR | Status: DC | PRN
  Administered 2016-08-30: 20 mg via INTRAVENOUS

## 2016-08-30 MED ORDER — MIDAZOLAM HCL 2 MG/2ML IJ SOLN
INTRAMUSCULAR | Status: AC
  Filled 2016-08-30: qty 2

## 2016-08-30 MED ORDER — DEXAMETHASONE SODIUM PHOSPHATE 10 MG/ML IJ SOLN
INTRAMUSCULAR | Status: AC
  Filled 2016-08-30: qty 1

## 2016-08-30 MED ORDER — OXYCODONE HCL 5 MG/5ML PO SOLN
5.0000 mg | Freq: Once | ORAL | Status: AC | PRN
  Filled 2016-08-30: qty 5

## 2016-08-30 MED ORDER — ONDANSETRON HCL 4 MG/2ML IJ SOLN
INTRAMUSCULAR | Status: AC
  Filled 2016-08-30: qty 2

## 2016-08-30 MED ORDER — SODIUM CHLORIDE 0.9 % IR SOLN
Status: DC | PRN
  Administered 2016-08-30: 4000 mL/h via INTRAVESICAL

## 2016-08-30 MED ORDER — EPHEDRINE 5 MG/ML INJ
INTRAVENOUS | Status: AC
  Filled 2016-08-30: qty 10

## 2016-08-30 MED ORDER — BELLADONNA ALKALOIDS-OPIUM 16.2-60 MG RE SUPP
RECTAL | Status: AC
  Filled 2016-08-30: qty 1

## 2016-08-30 MED ORDER — KETOROLAC TROMETHAMINE 30 MG/ML IJ SOLN
INTRAMUSCULAR | Status: DC | PRN
  Administered 2016-08-30: 30 mg via INTRAVENOUS

## 2016-08-30 SURGICAL SUPPLY — 37 items
BAG DRAIN URO-CYSTO SKYTR STRL (DRAIN) ×3 IMPLANT
BAG DRN UROCATH (DRAIN) ×2
BASKET DAKOTA 1.9FR 11X120 (BASKET) ×1 IMPLANT
BASKET LASER NITINOL 1.9FR (BASKET) IMPLANT
BASKET STNLS GEMINI 4WIRE 3FR (BASKET) IMPLANT
BASKET ZERO TIP NITINOL 2.4FR (BASKET) IMPLANT
BSKT STON RTRVL 120 1.9FR (BASKET)
BSKT STON RTRVL GEM 120X11 3FR (BASKET)
BSKT STON RTRVL ZERO TP 2.4FR (BASKET)
CATH INTERMIT  6FR 70CM (CATHETERS) ×1 IMPLANT
CLOTH BEACON ORANGE TIMEOUT ST (SAFETY) ×3 IMPLANT
ELECT REM PT RETURN 9FT ADLT (ELECTROSURGICAL)
ELECTRODE REM PT RTRN 9FT ADLT (ELECTROSURGICAL) IMPLANT
FIBER LASER FLEXIVA 365 (UROLOGICAL SUPPLIES) IMPLANT
FIBER LASER FLEXIVA 550 (UROLOGICAL SUPPLIES) IMPLANT
FIBER LASER TRAC TIP (UROLOGICAL SUPPLIES) IMPLANT
GLOVE BIO SURGEON STRL SZ8 (GLOVE) ×3 IMPLANT
GLOVE BIOGEL PI IND STRL 7.5 (GLOVE) IMPLANT
GLOVE BIOGEL PI INDICATOR 7.5 (GLOVE) ×2
GOWN STRL REUS W/ TWL LRG LVL3 (GOWN DISPOSABLE) ×2 IMPLANT
GOWN STRL REUS W/ TWL XL LVL3 (GOWN DISPOSABLE) ×2 IMPLANT
GOWN STRL REUS W/TWL LRG LVL3 (GOWN DISPOSABLE) ×3
GOWN STRL REUS W/TWL XL LVL3 (GOWN DISPOSABLE) ×3
GUIDEWIRE 0.038 PTFE COATED (WIRE) ×1 IMPLANT
GUIDEWIRE ANG ZIPWIRE 038X150 (WIRE) IMPLANT
GUIDEWIRE STR DUAL SENSOR (WIRE) ×3 IMPLANT
IV NS IRRIG 3000ML ARTHROMATIC (IV SOLUTION) ×5 IMPLANT
KIT BALLIN UROMAX 15FX10 (LABEL) IMPLANT
KIT BALLN UROMAX 15FX4 (MISCELLANEOUS) IMPLANT
KIT BALLN UROMAX 26 75X4 (MISCELLANEOUS)
KIT ROOM TURNOVER WOR (KITS) ×3 IMPLANT
MANIFOLD NEPTUNE II (INSTRUMENTS) IMPLANT
PACK CYSTO (CUSTOM PROCEDURE TRAY) ×3 IMPLANT
SET HIGH PRES BAL DIL (LABEL)
SHEATH ACCESS URETERAL 38CM (SHEATH) IMPLANT
TUBE CONNECTING 12X1/4 (SUCTIONS) IMPLANT
WATER STERILE IRR 3000ML UROMA (IV SOLUTION) IMPLANT

## 2016-08-30 NOTE — Op Note (Signed)
PATIENT:  Amanda Willis  PRE-OPERATIVE DIAGNOSIS: 1. Left ureteral stent 2. left Ureteral calculi  POST-OPERATIVE DIAGNOSIS: Same  PROCEDURE:  1. Removal of left ureteral stent 2. Left ureteroscopy and stone extraction 3. Left retrograde pyelogram with interpretation 4. Fluoroscopy use less than one hour.  SURGEON: Claybon Jabs, MD  INDICATION: Amanda Willis is a 55 year old female who developed significant left ureteral colic due to distal left ureteral calculi and underwent a double-J stent placement to help control her pain. She has not tolerated the stent well and has been having continued pain despite the fact the stent was in good position. There were at least 2 stones next to the stent in the distal ureter noted on her preoperative KUB. She is brought to the operating room today for definitive management of her left ureteral calculi.  ANESTHESIA:  General  EBL:  Minimal  DRAINS: None  SPECIMEN:  Stone given to patient  DESCRIPTION OF PROCEDURE: The patient was taken to the major OR and placed on the table. General anesthesia was administered and then the patient was moved to the dorsal lithotomy position. The genitalia was sterilely prepped and draped. An official timeout was performed.  Initially the 48 French cystoscope with 30 lens was passed under direct vision into the bladder. The bladder was then fully inspected. It was noted be free of any tumors, stones or inflammatory lesions. Ureteral orifices were of normal configuration and position with a stent noted exiting the left ureteral orifice. The stent was grasped with alligator forceps and withdrawn through the urethral meatus.   I then passed a 0.038 inch floppy-tipped guidewire through the stent and into the area of the renal pelvis and this was left in place. The stent was then removed.  A 6 French rigid ureteroscope was then passed under direct into the bladder and into the left orifice and up the ureter for a short  distance. 2 stones were identified. I used the [place] basket to engage the first stone and was able to easily extract the stone. I then engage the second stone and extracted this easily. I then reinspected the ureter one last time and advanced the scope about halfway up the ureter and noted no stone. In order to be sure there were no stones that had floated proximally I performed a retrograde pyelogram through the ureteroscope.  The left retrograde pyelogram was performed by injecting full-strength Omnipaque contrast through the ureteroscope under direct fluoroscopy. This revealed no filling defects within the ureter or within the collecting system.  Because the stent had been indwelling for some time it resulted in some dilation of the ureter and therefore I felt that because of this fact and the fact that I was easily able to remove the stones that a stent did not need to be replaced. In addition the patient was tolerating her stent quite poorly. I therefore drained the bladder. The patient tolerated the procedure well no intraoperative complications.  PLAN OF CARE: Discharge to home after PACU  PATIENT DISPOSITION:  PACU - hemodynamically stable.

## 2016-08-30 NOTE — Transfer of Care (Signed)
Immediate Anesthesia Transfer of Care Note  Patient: Amanda Willis  Procedure(s) Performed: Procedure(s) (LRB): CYSTOSCOPY LEFT STENT REMOVAL, LEFT RETROGRADE AND URETEROSCOPY (Left) STONE EXTRACTION WITH BASKET (Left)  Patient Location: PACU  Anesthesia Type: General  Level of Consciousness: awake, sedated, patient cooperative and responds to stimulation  Airway & Oxygen Therapy: Patient Spontanous Breathing and Patient connected to face mask oxygen  Post-op Assessment: Report given to PACU RN, Post -op Vital signs reviewed and stable and Patient moving all extremities  Post vital signs: Reviewed and stable  Complications: No apparent anesthesia complications

## 2016-08-30 NOTE — Anesthesia Postprocedure Evaluation (Signed)
Anesthesia Post Note  Patient: [name]  Procedure(s) Performed: Procedure(s) (LRB): CYSTOSCOPY LEFT STENT REMOVAL, LEFT RETROGRADE AND URETEROSCOPY (Left) STONE EXTRACTION WITH BASKET (Left)  Patient location during evaluation: PACU Anesthesia Type: General Level of consciousness: awake Pain management: pain level controlled Vital Signs Assessment: post-procedure vital signs reviewed and stable Respiratory status: spontaneous breathing Cardiovascular status: stable Postop Assessment: no signs of nausea or vomiting Anesthetic complications: no    Last Vitals:  Vitals:   08/30/16 1245 08/30/16 1300  BP: 137/67 117/70  Pulse: 83 99  Resp: (!) 24 17  Temp: 36.8 C     Last Pain:  Vitals:   08/30/16 1102  TempSrc:   PainSc: 6                  Amanda Willis

## 2016-08-30 NOTE — Discharge Instructions (Signed)
Post Anesthesia Home Care Instructions  Activity: Get plenty of rest for the remainder of the day. A responsible adult should stay with you for 24 hours following the procedure.  For the next 24 hours, DO NOT: -Drive a car -Paediatric nurse -Drink alcoholic beverages -Take any medication unless instructed by your physician -Make any legal decisions or sign important papers.  Meals: Start with liquid foods such as gelatin or soup. Progress to regular foods as tolerated. Avoid greasy, spicy, heavy foods. If nausea and/or vomiting occur, drink only clear liquids until the nausea and/or vomiting subsides. Call your physician if vomiting continues.  Special Instructions/Symptoms: Your throat may feel dry or sore from the anesthesia or the breathing tube placed in your throat during surgery. If this causes discomfort, gargle with warm salt water. The discomfort should disappear within 24 hours.  If you had a scopolamine patch placed behind your ear for the management of post- operative nausea and/or vomiting:  1. The medication in the patch is effective for 72 hours, after which it should be removed.  Wrap patch in a tissue and discard in the trash. Wash hands thoroughly with soap and water. 2. You may remove the patch earlier than 72 hours if you experience unpleasant side effects which may include dry mouth, dizziness or visual disturbances. 3. Avoid touching the patch. Wash your hands with soap and water after contact with the patch.   Post stone removal/stent placement surgery instructions   Definitions:  Ureter: The duct that transports urine from the kidney to the bladder. Stent: A plastic hollow tube that is placed into the ureter, from the kidney to the bladder to prevent the ureter from swelling shut.  General instructions:  Despite the fact that no skin incisions were used, the area around the ureter and bladder is raw and irritated. The stent is a foreign body which will  further irritate the bladder wall. This irritation is manifested by increased frequency of urination, both day and night, and by an increase in the urge to urinate. In some, the urge to urinate is present almost always. Sometimes the urge is strong enough that you may not be able to stop your self from urinating. The only real cure is to remove the stent and then give time for the bladder wall to heal which can't be done until the danger of the ureter swelling shut has passed. (This varies from 2-21 days).  You may see some blood in your urine while the stent is in place and a few days afterward. Do not be alarmed, even if the urine is clear for a while. Get off your feet and drink lots of fluids until clearing occurs. If you start to pass clots or don't improve, call us.  If you have a string coming from your urethra:  The stent string is attached to your ureteral stent.  Do not pull on thisIf you have a string coming from your urethra:  The stent string is attached to your ureteral stent.  Do not pull on this.  Diet:  You may return to your normal diet immediately. Because of the raw surface of your bladder, alcohol, spicy foods, foods high in acid and drinks with caffeine may cause irritation or frequency and should be used in moderation. To keep your urine flowing freely and avoid constipation, drink plenty of fluids during the day (8-10 glasses). Tip: Avoid cranberry juice because it is very acidic.  Activity:  Your physical activity doesn't need to be  be restricted. However, if you are very active, you may see some blood in the urine. We suggest that you reduce your activity under the circumstances until the bleeding has stopped.  Bowels:  It is important to keep your bowels regular during the postoperative period. Straining with bowel movements can cause bleeding. A bowel movement every other day is reasonable. Use a mild laxative if needed, such as milk of magnesia 2-3 tablespoons, or 2 Dulcolax  tablets. Call if you continue to have problems. If you had been taking narcotics for pain, before, during or after your surgery, you may be constipated. Take a laxative if necessary.     Medication:  You should resume your pre-surgery medications unless told not to. DO NOT RESUME YOUR ASPIRIN, or any other medicines like ibuprofen, motrin, excedrin, advil, aleve, vitamin E, fish oil as these can all cause bleeding x 7 days. In addition you may be given an antibiotic to prevent or treat infection. Antibiotics are not always necessary. All medication should be taken as prescribed until the bottles are finished unless you are having an unusual reaction to one of the drugs.  Problems you should report to us:  a. Fever greater than 101F. b. Heavy bleeding, or clots (see notes above about blood in urine). c. Inability to urinate. d. Drug reactions (hives, rash, nausea, vomiting, diarrhea). e. Severe burning or pain with urination that is not improving.  Followup:  You will need a followup appointment to monitor your progress in most cases. Please call the office for this appointment when you get home if your appointment has not already been scheduled. Usually the first appointment will be about 5-14 days after your surgery and if you have a stent in place it will likely be removed at that time.  

## 2016-08-30 NOTE — Anesthesia Procedure Notes (Signed)
Procedure Name: LMA Insertion Date/Time: 08/30/2016 12:12 PM Performed by: Justice Rocher Pre-anesthesia Checklist: Patient identified, Emergency Drugs available, Suction available and Patient being monitored Patient Re-evaluated:Patient Re-evaluated prior to inductionOxygen Delivery Method: Circle system utilized Preoxygenation: Pre-oxygenation with 100% oxygen Intubation Type: IV induction Ventilation: Mask ventilation without difficulty LMA: LMA inserted LMA Size: 4.0 Number of attempts: 1 Airway Equipment and Method: Bite block Placement Confirmation: positive ETCO2 Tube secured with: Tape Dental Injury: Teeth and Oropharynx as per pre-operative assessment

## 2016-08-30 NOTE — Anesthesia Preprocedure Evaluation (Signed)
Anesthesia Evaluation  Patient identified by MRN, date of birth, ID band Patient awake    Reviewed: Allergy & Precautions, NPO status , Patient's Chart, lab work & pertinent test results  History of Anesthesia Complications (+) PONV and history of anesthetic complications  Airway Mallampati: III  TM Distance: >3 FB Neck ROM: Full    Dental  (+) Teeth Intact   Pulmonary    breath sounds clear to auscultation       Cardiovascular hypertension, Pt. on medications  Rhythm:Regular     Neuro/Psych  Headaches,  Neuromuscular disease negative psych ROS   GI/Hepatic hiatal hernia, GERD  Medicated and Controlled,  Endo/Other  Morbid obesity  Renal/GU Renal disease     Musculoskeletal negative musculoskeletal ROS (+)   Abdominal   Peds  Hematology   Anesthesia Other Findings   Reproductive/Obstetrics                             Anesthesia Physical Anesthesia Plan  ASA: II  Anesthesia Plan: General   Post-op Pain Management:    Induction: Intravenous  Airway Management Planned: LMA  Additional Equipment: None  Intra-op Plan:   Post-operative Plan: Extubation in OR  Informed Consent: I have reviewed the patients History and Physical, chart, labs and discussed the procedure including the risks, benefits and alternatives for the proposed anesthesia with the patient or authorized representative who has indicated his/her understanding and acceptance.   Dental advisory given  Plan Discussed with: CRNA and Surgeon  Anesthesia Plan Comments:         Anesthesia Quick Evaluation

## 2016-08-31 ENCOUNTER — Encounter (HOSPITAL_BASED_OUTPATIENT_CLINIC_OR_DEPARTMENT_OTHER): Payer: Self-pay | Admitting: Urology

## 2017-07-13 ENCOUNTER — Encounter (HOSPITAL_COMMUNITY): Payer: Self-pay | Admitting: *Deleted

## 2017-07-13 ENCOUNTER — Other Ambulatory Visit: Payer: Self-pay | Admitting: Urology

## 2017-07-13 NOTE — Progress Notes (Signed)
Patient added on for lithotripsy tomorrow. RN called patient twice (two hours apart)  and left messages that she will need to come in today for an EKG due to cardiac history. This RN called Juliann Pulse Orthoptist at [facility]) to verify need for EKG and she stated patient does have to have one within the past year. Last EKG on file in [facility] is over a year ago. Hopefully patient will call back and be able to come in for EKG today. Called scheduling office at [facility] and left message regarding above in case unable to reach patient today/obtain EKG as she would most likely need to be rescheduled due to [employer].

## 2017-07-14 ENCOUNTER — Encounter (HOSPITAL_COMMUNITY)
Admission: RE | Disposition: A | Discharge status: Home / Self Care | Payer: Self-pay | Source: Ambulatory Visit | Attending: Urology

## 2017-07-14 ENCOUNTER — Ambulatory Visit (HOSPITAL_COMMUNITY)
Admission: RE | Admit: 2017-07-14 | Discharge: 2017-07-14 | Disposition: A | Discharge status: Home / Self Care | Payer: Medicare Other | Source: Ambulatory Visit | Attending: Urology | Admitting: Urology

## 2017-07-14 ENCOUNTER — Ambulatory Visit (HOSPITAL_COMMUNITY): Payer: Medicare Other

## 2017-07-14 ENCOUNTER — Encounter (HOSPITAL_COMMUNITY): Payer: Self-pay | Admitting: *Deleted

## 2017-07-14 DIAGNOSIS — Z79891 Long term (current) use of opiate analgesic: Secondary | ICD-10-CM

## 2017-07-14 DIAGNOSIS — Z87442 Personal history of urinary calculi: Secondary | ICD-10-CM

## 2017-07-14 DIAGNOSIS — N200 Calculus of kidney: Secondary | ICD-10-CM

## 2017-07-14 DIAGNOSIS — Z7982 Long term (current) use of aspirin: Secondary | ICD-10-CM

## 2017-07-14 DIAGNOSIS — Z888 Allergy status to other drugs, medicaments and biological substances status: Secondary | ICD-10-CM

## 2017-07-14 DIAGNOSIS — Z79899 Other long term (current) drug therapy: Secondary | ICD-10-CM

## 2017-07-14 DIAGNOSIS — N132 Hydronephrosis with renal and ureteral calculous obstruction: Secondary | ICD-10-CM

## 2017-07-14 DIAGNOSIS — N201 Calculus of ureter: Secondary | ICD-10-CM | POA: Insufficient documentation

## 2017-07-14 HISTORY — PX: EXTRACORPOREAL SHOCK WAVE LITHOTRIPSY: SHX1557

## 2017-07-14 SURGERY — LITHOTRIPSY, ESWL
Anesthesia: LOCAL | Laterality: Right

## 2017-07-14 MED ORDER — SODIUM CHLORIDE 0.9 % IV SOLN
INTRAVENOUS | Status: DC
  Administered 2017-07-14: 16:00:00 via INTRAVENOUS

## 2017-07-14 MED ORDER — ACETAMINOPHEN 10 MG/ML IV SOLN
1000.0000 mg | Freq: Once | INTRAVENOUS | Status: AC
  Administered 2017-07-14: 1000 mg via INTRAVENOUS
  Filled 2017-07-14: qty 100

## 2017-07-14 MED ORDER — HYDROMORPHONE HCL-NACL 0.5-0.9 MG/ML-% IV SOSY: 1.0000 mg | PREFILLED_SYRINGE | Freq: Once | INTRAVENOUS | Status: DC

## 2017-07-14 MED ORDER — DIAZEPAM 5 MG PO TABS
10.0000 mg | ORAL_TABLET | ORAL | Status: AC
  Administered 2017-07-14: 10 mg via ORAL
  Filled 2017-07-14: qty 2

## 2017-07-14 MED ORDER — HYDROMORPHONE HCL 1 MG/ML IJ SOLN
1.0000 mg | Freq: Once | INTRAMUSCULAR | Status: AC
  Administered 2017-07-14: 1 mg via INTRAVENOUS
  Filled 2017-07-14: qty 1

## 2017-07-14 MED ORDER — DIPHENHYDRAMINE HCL 25 MG PO CAPS
25.0000 mg | ORAL_CAPSULE | ORAL | Status: AC
  Administered 2017-07-14: 25 mg via ORAL
  Filled 2017-07-14: qty 1

## 2017-07-14 MED ORDER — LEVOFLOXACIN IN D5W 500 MG/100ML IV SOLN
500.0000 mg | INTRAVENOUS | Status: AC
  Administered 2017-07-14: 500 mg via INTRAVENOUS
  Filled 2017-07-14: qty 100

## 2017-07-14 NOTE — Discharge Instructions (Signed)
Lithotripsy, Care After °This sheet gives you information about how to care for yourself after your procedure. Your health care provider may also give you more specific instructions. If you have problems or questions, contact your health care provider. °What can I expect after the procedure? °After the procedure, it is common to have: °· Some blood in your urine. This should only last for a few days. °· Soreness in your back, sides, or upper abdomen for a few days. °· Blotches or bruises on your back where the pressure wave entered the skin. °· Pain, discomfort, or nausea when pieces (fragments) of the kidney stone move through the tube that carries urine from the kidney to the bladder (ureter). Stone fragments may pass soon after the procedure, but they may continue to pass for up to 4-8 weeks. °? If you have severe pain or nausea, contact your health care provider. This may be caused by a large stone that was not broken up, and this may mean that you need more treatment. °· Some pain or discomfort during urination. °· Some pain or discomfort in the lower abdomen or (in men) at the base of the penis. ° °Follow these instructions at home: °Medicines °· Take over-the-counter and prescription medicines only as told by your health care provider. °· If you were prescribed an antibiotic medicine, take it as told by your health care provider. Do not stop taking the antibiotic even if you start to feel better. °· Do not drive for 24 hours if you were given a medicine to help you relax (sedative). °· Do not drive or use heavy machinery while taking prescription pain medicine. °Eating and drinking °· Drink enough water and fluids to keep your urine clear or pale yellow. This helps any remaining pieces of the stone to pass. It can also help prevent new stones from forming. °· Eat plenty of fresh fruits and vegetables. °· Follow instructions from your health care provider about eating and drinking restrictions. You may be  instructed: °? To reduce how much salt (sodium) you eat or drink. Check ingredients and nutrition facts on packaged foods and beverages. °? To reduce how much meat you eat. °· Eat the recommended amount of calcium for your age and gender. Ask your health care provider how much calcium you should have. °General instructions °· Get plenty of rest. °· Most people can resume normal activities 1-2 days after the procedure. Ask your health care provider what activities are safe for you. °· If directed, strain all urine through the strainer that was provided by your health care provider. °? Keep all fragments for your health care provider to see. Any stones that are found may be sent to a medical lab for examination. The stone may be as small as a grain of salt. °· Keep all follow-up visits as told by your health care provider. This is important. °Contact a health care provider if: °· You have pain that is severe or does not get better with medicine. °· You have nausea that is severe or does not go away. °· You have blood in your urine longer than your health care provider told you to expect. °· You have more blood in your urine. °· You have pain during urination that does not go away. °· You urinate more frequently than usual and this does not go away. °· You develop a rash or any other possible signs of an allergic reaction. °Get help right away if: °· You have severe pain in   your back, sides, or upper abdomen.  You have severe pain while urinating.  Your urine is very dark red.  You have blood in your stool (feces).  You cannot pass any urine at all.  You feel a strong urge to urinate after emptying your bladder.  You have a fever or chills.  You develop shortness of breath, difficulty breathing, or chest pain.  You have severe nausea that leads to persistent vomiting.  You faint. Summary  After this procedure, it is common to have some pain, discomfort, or nausea when pieces (fragments) of the  kidney stone move through the tube that carries urine from the kidney to the bladder (ureter). If this pain or nausea is severe, however, you should contact your health care provider.  Most people can resume normal activities 1-2 days after the procedure. Ask your health care provider what activities are safe for you.  Drink enough water and fluids to keep your urine clear or pale yellow. This helps any remaining pieces of the stone to pass, and it can help prevent new stones from forming.  If directed, strain your urine and keep all fragments for your health care provider to see. Fragments or stones may be as small as a grain of salt.  Get help right away if you have severe pain in your back, sides, or upper abdomen or have severe pain while urinating. This information is not intended to replace advice given to you by your health care provider. Make sure you discuss any questions you have with your health care provider. Document Released: 11/21/2007 Document Revised: 09/22/2016 Document Reviewed: 09/22/2016 Elsevier Interactive Patient Education  2017 [place].      Moderate Conscious Sedation, Adult, Care After These instructions provide you with information about caring for yourself after your procedure. Your health care provider may also give you more specific instructions. Your treatment has been planned according to current medical practices, but problems sometimes occur. Call your health care provider if you have any problems or questions after your procedure. What can I expect after the procedure? After your procedure, it is common:  To feel sleepy for several hours.  To feel clumsy and have poor balance for several hours.  To have poor judgment for several hours.  To vomit if you eat too soon.  Follow these instructions at home: For at least 24 hours after the procedure:   Do not: ? Participate in activities where you could fall or become injured. ? Drive. ? Use  heavy machinery. ? Drink alcohol. ? Take sleeping pills or medicines that cause drowsiness. ? Make important decisions or sign legal documents. ? Take care of children on your own.  Rest. Eating and drinking  Follow the diet recommended by your health care provider.  If you vomit: ? Drink water, juice, or soup when you can drink without vomiting. ? Make sure you have little or no nausea before eating solid foods. General instructions  Have a responsible adult stay with you until you are awake and alert.  Take over-the-counter and prescription medicines only as told by your health care provider.  If you smoke, do not smoke without supervision.  Keep all follow-up visits as told by your health care provider. This is important. Contact a health care provider if:  You keep feeling nauseous or you keep vomiting.  You feel light-headed.  You develop a rash.  You have a fever. Get help right away if:  You have trouble breathing. This information is  not intended to replace advice given to you by your health care provider. Make sure you discuss any questions you have with your health care provider. Document Released: 08/22/2013 Document Revised: 04/05/2016 Document Reviewed: 02/21/2016 Elsevier Interactive Patient Education  [employer].

## 2017-07-14 NOTE — Progress Notes (Signed)
Spoke with Selita at [facility] and she states she spoke to the MD about the EKG.  Pt needs an EKG prior to her lithotripsy due to her history.  There is uncertainty with getting the EKG on the day of procedure because of the timing of getting it read by cardiologist.  So the urology MD called the pt and told her to call her primary MD on Thursday morning and see if they can do an EKG, print it and read it and send with the pt for her litho 8/30 1630.  If this can not be done then the pt's litho procedure will probably be canceled.

## 2017-07-14 NOTE — Progress Notes (Addendum)
Paged Dr. Junious Silk regarding patient's pain.

## 2017-07-14 NOTE — Progress Notes (Signed)
Pt called to state has had EKG preformed this am with [facility] and will bring copy of EKG with her to appt this afternoon

## 2017-07-15 ENCOUNTER — Other Ambulatory Visit: Payer: Self-pay | Admitting: Urology

## 2017-07-15 ENCOUNTER — Inpatient Hospital Stay (HOSPITAL_COMMUNITY)
Admission: AD | Admit: 2017-07-15 | Discharge: 2017-07-19 | DRG: 669 | Disposition: A | Discharge status: Home / Self Care | Payer: Medicare Other | Source: Ambulatory Visit | Attending: Urology | Admitting: Urology

## 2017-07-15 ENCOUNTER — Encounter (HOSPITAL_COMMUNITY): Payer: Self-pay | Admitting: Urology

## 2017-07-15 DIAGNOSIS — K219 Gastro-esophageal reflux disease without esophagitis: Secondary | ICD-10-CM | POA: Diagnosis present

## 2017-07-15 DIAGNOSIS — Z7982 Long term (current) use of aspirin: Secondary | ICD-10-CM

## 2017-07-15 DIAGNOSIS — G629 Polyneuropathy, unspecified: Secondary | ICD-10-CM | POA: Diagnosis present

## 2017-07-15 DIAGNOSIS — Z87442 Personal history of urinary calculi: Secondary | ICD-10-CM

## 2017-07-15 DIAGNOSIS — Z79899 Other long term (current) drug therapy: Secondary | ICD-10-CM

## 2017-07-15 DIAGNOSIS — Z885 Allergy status to narcotic agent status: Secondary | ICD-10-CM

## 2017-07-15 DIAGNOSIS — Z79891 Long term (current) use of opiate analgesic: Secondary | ICD-10-CM

## 2017-07-15 DIAGNOSIS — N132 Hydronephrosis with renal and ureteral calculous obstruction: Principal | ICD-10-CM | POA: Diagnosis present

## 2017-07-15 DIAGNOSIS — N201 Calculus of ureter: Secondary | ICD-10-CM | POA: Diagnosis present

## 2017-07-15 DIAGNOSIS — Z981 Arthrodesis status: Secondary | ICD-10-CM

## 2017-07-15 DIAGNOSIS — I10 Essential (primary) hypertension: Secondary | ICD-10-CM | POA: Diagnosis present

## 2017-07-15 DIAGNOSIS — Z888 Allergy status to other drugs, medicaments and biological substances status: Secondary | ICD-10-CM

## 2017-07-15 DIAGNOSIS — Z905 Acquired absence of kidney: Secondary | ICD-10-CM

## 2017-07-15 DIAGNOSIS — N200 Calculus of kidney: Secondary | ICD-10-CM

## 2017-07-15 LAB — BASIC METABOLIC PANEL
Anion gap: 8 (ref 5–15)
BUN: 23 mg/dL — AB (ref 6–20)
CALCIUM: 9.5 mg/dL (ref 8.9–10.3)
CHLORIDE: 102 mmol/L (ref 101–111)
CO2: 26 mmol/L (ref 22–32)
CREATININE: 0.94 mg/dL (ref 0.44–1.00)
GFR calc Af Amer: >60 mL/min (ref >60)
Glucose, Bld: 145 mg/dL — ABNORMAL HIGH (ref 65–99)
Potassium: 3.3 mmol/L — ABNORMAL LOW (ref 3.5–5.1)
SODIUM: 136 mmol/L (ref 135–145)

## 2017-07-15 LAB — HEMOGLOBIN AND HEMATOCRIT, BLOOD
HCT: 41.5 % (ref 36.0–46.0)
HEMOGLOBIN: 14.3 g/dL (ref 12.0–15.0)

## 2017-07-15 MED ORDER — SODIUM CHLORIDE 0.45 % IV SOLN
INTRAVENOUS | Status: DC
  Administered 2017-07-15 – 2017-07-17 (×6): via INTRAVENOUS

## 2017-07-15 MED ORDER — ONDANSETRON HCL 4 MG/2ML IJ SOLN
4.0000 mg | INTRAMUSCULAR | Status: DC | PRN
  Administered 2017-07-15 – 2017-07-16 (×3): 4 mg via INTRAVENOUS
  Filled 2017-07-15 (×3): qty 2

## 2017-07-15 MED ORDER — OXYCODONE HCL 5 MG PO TABS: 5.0000 mg | ORAL_TABLET | ORAL | Status: DC | PRN

## 2017-07-15 MED ORDER — VENLAFAXINE HCL ER 37.5 MG PO CP24
37.5000 mg | ORAL_CAPSULE | Freq: Every day | ORAL | Status: DC
  Administered 2017-07-15 – 2017-07-18 (×4): 37.5 mg via ORAL
  Filled 2017-07-15 (×4): qty 1

## 2017-07-15 MED ORDER — ACETAMINOPHEN 325 MG PO TABS: 650.0000 mg | ORAL_TABLET | ORAL | Status: DC | PRN

## 2017-07-15 MED ORDER — OXYBUTYNIN CHLORIDE 5 MG PO TABS
5.0000 mg | ORAL_TABLET | Freq: Three times a day (TID) | ORAL | Status: DC | PRN
  Administered 2017-07-17 (×2): 5 mg via ORAL
  Filled 2017-07-15 (×2): qty 1

## 2017-07-15 MED ORDER — PROMETHAZINE HCL 25 MG PO TABS: 12.5000 mg | ORAL_TABLET | ORAL | Status: DC | PRN

## 2017-07-15 MED ORDER — CEPHALEXIN 500 MG PO CAPS
500.0000 mg | ORAL_CAPSULE | Freq: Three times a day (TID) | ORAL | Status: DC
  Administered 2017-07-15 – 2017-07-17 (×5): 500 mg via ORAL
  Filled 2017-07-15 (×5): qty 1

## 2017-07-15 MED ORDER — HYDROMORPHONE HCL-NACL 0.5-0.9 MG/ML-% IV SOSY
0.5000 mg | PREFILLED_SYRINGE | INTRAVENOUS | Status: DC | PRN
  Administered 2017-07-15 – 2017-07-17 (×11): 1 mg via INTRAVENOUS
  Filled 2017-07-15 (×11): qty 2

## 2017-07-15 MED ORDER — KETOROLAC TROMETHAMINE 15 MG/ML IJ SOLN
15.0000 mg | Freq: Four times a day (QID) | INTRAMUSCULAR | Status: DC | PRN
  Administered 2017-07-15 – 2017-07-18 (×8): 15 mg via INTRAVENOUS
  Filled 2017-07-15 (×8): qty 1

## 2017-07-15 MED ORDER — SENNA 8.6 MG PO TABS
1.0000 | ORAL_TABLET | Freq: Two times a day (BID) | ORAL | Status: DC
  Administered 2017-07-15 – 2017-07-19 (×8): 8.6 mg via ORAL
  Filled 2017-07-15 (×8): qty 1

## 2017-07-15 MED ORDER — PROMETHAZINE HCL 25 MG/ML IJ SOLN
12.5000 mg | Freq: Four times a day (QID) | INTRAMUSCULAR | Status: DC | PRN
  Administered 2017-07-15: 12.5 mg via INTRAVENOUS
  Filled 2017-07-15: qty 1

## 2017-07-15 NOTE — H&P (Signed)
H&P  Chief Complaint: flank pain  History of Present Illness: Amanda Willis is a 56 y.o. year old female who yesterday underwent shockwave lithotripsy of a 12 mm right renal pelvic/UPJ stone.  The patient presented this morning with flank pain, nausea and vomiting which was not totally relieved in the office with Toradol and Phenergan.  CT stone protocol revealed obstructing fragment at the right UVJ with multiple fragments in the right lower pole and some in the right distal ureter.  Because of her persistent pain and nausea and vomiting, she is admitted for pain management and hydration.  Past Medical History:  Diagnosis Date  . Family history of adverse reaction to anesthesia    "Dad was very hard to wake up"  . GERD (gastroesophageal reflux disease)   . Hiatal hernia   . History of benign kidney tumor excluding renal pelvis    s/p  partial right nerphrectomy for mass--  benign 1986  . History of kidney stones   . Hyperlipidemia   . Hypertension   . LAFB (left anterior fascicular block)   . Left ureteral stone   . Migraine   . Nephrolithiasis    per ct 08-16-2016 right side non-obstructive 50mm  . 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 PONV (postoperative nausea and vomiting)    "hard to wake up"  . Urgency of urination   . Wears glasses     Past Surgical History:  Procedure Laterality Date  . ANTERIOR AND POSTERIOR REPAIR N/A 03/15/2013   Procedure: POSTERIOR REPAIR;  Surgeon: Cyril Mourning, MD;  Location: [facility];  Service: Gynecology;  Laterality: N/A;  . ANTERIOR CERVICAL DECOMP/DISCECTOMY FUSION  05/13/2008   C5 -- C7  . CYSTOSCOPY W/ URETERAL STENT PLACEMENT Left 08/16/2016   Procedure: CYSTOSCOPY WITH RETROGRADE PYELOGRAM/LEFT URETERAL STENT PLACEMENT;  Surgeon: Nickie Retort, MD;  Location: [facility];  Service: Urology;  Laterality: Left;  . CYSTOSCOPY WITH RETROGRADE PYELOGRAM, URETEROSCOPY AND STENT PLACEMENT Left  08/30/2016   Procedure: CYSTOSCOPY LEFT STENT REMOVAL, LEFT RETROGRADE AND URETEROSCOPY;  Surgeon: Kathie Rhodes, MD;  Location: [facility];  Service: Urology;  Laterality: Left;  . EXTERNAL HEMORRHOIDECOTMY AND  RECTOCELE REPAIR  04/03/2007  . EXTRACORPOREAL SHOCK WAVE LITHOTRIPSY Right 07/14/2017   Procedure: RIGHT EXTRACORPOREAL SHOCK WAVE LITHOTRIPSY (ESWL);  Surgeon: Cleon Gustin, MD;  Location: [facility];  Service: Urology;  Laterality: Right;  . PARTIAL NEPHRECTOMY Right 1986  . POSTERIOR FUSION CERVICAL SPINE  10/24/2009    lateral  w/ vertex rod  C5 -- C7  . REDUCTION MAMMAPLASTY Bilateral 1992  . STONE EXTRACTION WITH BASKET Left 08/30/2016   Procedure: STONE EXTRACTION WITH BASKET;  Surgeon: Kathie Rhodes, MD;  Location: [facility];  Service: Urology;  Laterality: Left;  . TONSILLECTOMY AND ADENOIDECTOMY  age 54  . TOTAL ABDOMINAL HYSTERECTOMY W/ BILATERAL SALPINGOOPHORECTOMY  1984    Home Medications:  Medications Prior to Admission  Medication Sig Dispense Refill  . aspirin EC 81 MG tablet Take 81 mg by mouth daily.    . cephALEXin (KEFLEX) 500 MG capsule Take 500 mg by mouth 3 (three) times daily.    Marland Kitchen esomeprazole (NEXIUM) 40 MG capsule Take 40 mg by mouth daily before breakfast.    . fluticasone (FLONASE) 50 MCG/ACT nasal spray Place 2 sprays into both nostrils daily as needed for rhinitis or allergies.     . hydrochlorothiazide (HYDRODIURIL) 50 MG tablet  Take 50 mg by mouth every morning.     Marland Kitchen LINZESS 145 MCG CAPS capsule Take 145 mcg by mouth daily before breakfast.     . lisinopril (PRINIVIL,ZESTRIL) 10 MG tablet Take 10 mg by mouth daily.    . ondansetron (ZOFRAN) 4 MG tablet TAKE 1 TABLET EVER 6 HOURS AS NEEDED FOR NAUSEA AND VOMITING  0  . oxyCODONE (OXY IR/ROXICODONE) 5 MG immediate release tablet Take 5 mg by mouth every 6 (six) hours as needed for moderate pain or severe pain.     Marland Kitchen oxyCODONE-acetaminophen (PERCOCET) 10-325 MG  tablet Take 1 tablet by mouth every 4 (four) hours as needed for pain.    . tamsulosin (FLOMAX) 0.4 MG CAPS capsule Take 0.4 mg by mouth daily.  0  . venlafaxine (EFFEXOR) 37.5 MG tablet Take 37.5 mg by mouth at bedtime.     . docusate sodium 100 MG CAPS Take 100 mg by mouth daily. (Patient taking differently: Take 100 mg by mouth daily as needed (constipation). ) 10 capsule 0  . phenazopyridine (PYRIDIUM) 200 MG tablet Take 1 tablet (200 mg total) by mouth 3 (three) times daily as needed for pain. (Patient not taking: Reported on 07/15/2017) 16 tablet 0  . traMADol (ULTRAM) 50 MG tablet Take 1 tablet (50 mg total) by mouth every 6 (six) hours as needed. (Patient not taking: Reported on 07/15/2017) 16 tablet 0    Allergies:  Allergies  Allergen Reactions  . Morphine And Related Shortness Of Breath, Itching and Rash  . Reglan [Metoclopramide] Anxiety    No family history on file.  Social History:  reports that she has never smoked. She has never used smokeless tobacco. She reports that she does not drink alcohol or use drugs.  ROS: A complete review of systems was performed.  All systems are negative except for pertinent findings as noted.  Physical Exam:  Vital signs in last 24 hours: Temp:  [97.7 F (36.5 C)-98.6 F (37 C)] 98.6 F (37 C) (08/31 1443) Pulse Rate:  [73-79] 79 (08/31 1443) Resp:  [16-20] 20 (08/31 1443) BP: (156-168)/(73-97) 168/73 (08/31 1443) SpO2:  [93 %-99 %] 99 % (08/31 1443) General:  Alert and oriented, in significant distress HEENT: Normocephalic, atraumatic Neck: No JVD or lymphadenopathy Cardiovascular: Regular rate  Lungs: normal respiratory effort Extremities: No edema Neurologic: Grossly intact  Laboratory Data:  Results for orders placed or performed during the hospital encounter of 07/15/17 (from the past 24 hour(s))  Hemoglobin and hematocrit, blood     Status: None   Collection Time: 07/15/17  3:22 PM  Result Value Ref Range   Hemoglobin  14.3 12.0 - 15.0 g/dL   HCT 41.5 36.0 - 69.6 %  Basic metabolic panel     Status: Abnormal   Collection Time: 07/15/17  3:22 PM  Result Value Ref Range   Sodium 136 135 - 145 mmol/L   Potassium 3.3 (L) 3.5 - 5.1 mmol/L   Chloride 102 101 - 111 mmol/L   CO2 26 22 - 32 mmol/L   Glucose, Bld 145 (H) 65 - 99 mg/dL   BUN 23 (H) 6 - 20 mg/dL   Creatinine, Ser 0.94 0.44 - 1.00 mg/dL   Calcium 9.5 8.9 - 10.3 mg/dL   GFR calc non Af Amer >60 >60 mL/min   GFR calc Af Amer >60 >60 mL/min   Anion gap 8 5 - 15   No results found for this or any previous visit (from the past 240 hour(s)).  Creatinine:  Recent Labs  07/15/17 1522  CREATININE 0.94    Radiologic Imaging: Dg Abd 1 View  Result Date: 07/14/2017 CLINICAL DATA:  Lithotripsy. EXAM: ABDOMEN - 1 VIEW COMPARISON:  CT 07/07/2017. FINDINGS: Soft tissue structures are unremarkable. 1.3 cm right renal/UPJ stone again noted. Abdominopelvic calcifications again noted consistent phleboliths. No bowel distention. No acute bony abnormality . IMPRESSION: Right renal UPJ stone again noted. Similar findings noted on prior exam . Electronically Signed   By: [place]   On: 07/14/2017 14:55   CT abdomen and pelvis revealed mild to moderate right hydronephrosis, no perinephric hematoma on the right.  Previously mentioned calculi noted Impression/Assessment:  Recent lithotripsy of a 12 mm right renal stone with significant pain, nausea and vomiting. Stones are moving distally with some stone material still in her lower pole  Plan:  She is admitted for pain management, hydration.  Hopefully, with pain management she will not need stent  Jorja Loa 07/15/2017, 5:58 PM  Lillette Boxer. Redell Nazir MD

## 2017-07-15 NOTE — Progress Notes (Signed)
Patient arrived to unit. Pt is alert oriented x4, ambulatory with assist d/t increased pain. Pt c/o pain 9/10, nausea. Provider office contacted top receive orders

## 2017-07-16 DIAGNOSIS — N200 Calculus of kidney: Secondary | ICD-10-CM

## 2017-07-16 DIAGNOSIS — Z905 Acquired absence of kidney: Secondary | ICD-10-CM | POA: Diagnosis not present

## 2017-07-16 DIAGNOSIS — Z79891 Long term (current) use of opiate analgesic: Secondary | ICD-10-CM | POA: Diagnosis not present

## 2017-07-16 DIAGNOSIS — Z7982 Long term (current) use of aspirin: Secondary | ICD-10-CM | POA: Diagnosis not present

## 2017-07-16 DIAGNOSIS — I10 Essential (primary) hypertension: Secondary | ICD-10-CM | POA: Diagnosis present

## 2017-07-16 DIAGNOSIS — Z79899 Other long term (current) drug therapy: Secondary | ICD-10-CM | POA: Diagnosis not present

## 2017-07-16 DIAGNOSIS — N132 Hydronephrosis with renal and ureteral calculous obstruction: Secondary | ICD-10-CM | POA: Diagnosis present

## 2017-07-16 DIAGNOSIS — Z87442 Personal history of urinary calculi: Secondary | ICD-10-CM | POA: Diagnosis not present

## 2017-07-16 DIAGNOSIS — Z981 Arthrodesis status: Secondary | ICD-10-CM | POA: Diagnosis not present

## 2017-07-16 DIAGNOSIS — K219 Gastro-esophageal reflux disease without esophagitis: Secondary | ICD-10-CM | POA: Diagnosis present

## 2017-07-16 DIAGNOSIS — Z888 Allergy status to other drugs, medicaments and biological substances status: Secondary | ICD-10-CM | POA: Diagnosis not present

## 2017-07-16 DIAGNOSIS — G629 Polyneuropathy, unspecified: Secondary | ICD-10-CM | POA: Diagnosis present

## 2017-07-16 DIAGNOSIS — Z885 Allergy status to narcotic agent status: Secondary | ICD-10-CM | POA: Diagnosis not present

## 2017-07-16 LAB — HIV ANTIBODY (ROUTINE TESTING W REFLEX): HIV Screen 4th Generation wRfx: NONREACTIVE

## 2017-07-16 MED ORDER — TAMSULOSIN HCL 0.4 MG PO CAPS
0.4000 mg | ORAL_CAPSULE | Freq: Every day | ORAL | Status: DC
  Administered 2017-07-16 – 2017-07-19 (×4): 0.4 mg via ORAL
  Filled 2017-07-16 (×4): qty 1

## 2017-07-16 MED ORDER — ACETAMINOPHEN 10 MG/ML IV SOLN
1000.0000 mg | Freq: Four times a day (QID) | INTRAVENOUS | Status: AC
Start: 2017-07-16 — End: 2017-07-17
  Administered 2017-07-16 – 2017-07-17 (×4): 1000 mg via INTRAVENOUS
  Filled 2017-07-16 (×4): qty 100

## 2017-07-16 NOTE — Progress Notes (Signed)
Urology Inpatient Progress Report  Right flank pain right ureteral stone    Intv/Subj: Pain still significant - using IV dilaudid this AM Complaining of nausea as well Afebrile  Active Problems:   Calculus, renal  Current Facility-Administered Medications  Medication Dose Route Frequency Provider Last Rate Last Dose  . 0.45 % sodium chloride infusion   Intravenous Continuous Ardis Hughs, MD 75 mL/hr at 07/16/17 [phone]    . acetaminophen (OFIRMEV) IV 1,000 mg  1,000 mg Intravenous Q6H Ardis Hughs, MD      . cephALEXin [facility]) capsule 500 mg  500 mg Oral TID Ardis Hughs, MD   500 mg at 07/15/17 2131  . HYDROmorphone (DILAUDID) injection 0.5-1 mg  0.5-1 mg Intravenous Q2H PRN Franchot Gallo, MD   1 mg at 07/16/17 0842  . ketorolac (TORADOL) 15 MG/ML injection 15 mg  15 mg Intravenous Q6H PRN Ardis Hughs, MD   15 mg at 07/16/17 0255  . ondansetron (ZOFRAN) injection 4 mg  4 mg Intravenous Q4H PRN Franchot Gallo, MD   4 mg at 07/16/17 0303  . oxybutynin (DITROPAN) tablet 5 mg  5 mg Oral Q8H PRN Franchot Gallo, MD      . oxyCODONE (Oxy IR/ROXICODONE) immediate release tablet 5 mg  5 mg Oral Q4H PRN Franchot Gallo, MD      . promethazine (PHENERGAN) tablet 12.5 mg  12.5 mg Oral Q4H PRN Franchot Gallo, MD       Or  . promethazine (PHENERGAN) injection 12.5 mg  12.5 mg Intravenous Q6H PRN Franchot Gallo, MD   12.5 mg at 07/15/17 1504  . senna (SENOKOT) tablet 8.6 mg  1 tablet Oral BID Franchot Gallo, MD   8.6 mg at 07/15/17 2131  . tamsulosin (FLOMAX) capsule 0.4 mg  0.4 mg Oral Daily Ardis Hughs, MD      . venlafaxine XR [facility]) 24 hr capsule 37.5 mg  37.5 mg Oral QHS Ardis Hughs, MD   37.5 mg at 07/15/17 2149     Objective: Vital: Vitals:   07/15/17 1443 07/15/17 2016 07/15/17 2201 07/16/17 0526  BP: (!) 168/73  127/73 106/65  Pulse: 79  69 81  Resp: 20  16 (!) 24  Temp: 98.6 F (37 C)  98.5 F (36.9 C)  (!) 97.5 F (36.4 C)  TempSrc: Oral  Oral Oral  SpO2: 99%  95% 97%  Weight:  102.6 kg (226 lb 3.1 oz)    Height:  5\' 7"  (1.702 m)     I/Os: I/O last 3 completed shifts: In: 1125 [I.V.:1125] Out: 400 [Urine:400]  Physical Exam:  General: Patient is in no apparent distress Lungs: Normal respiratory effort, chest expands symmetrically. GI: right CVA and abdominal tenderness Ext: lower extremities symmetric  Lab Results:  Recent Labs  07/15/17 1522  HGB 14.3  HCT 41.5    Recent Labs  07/15/17 1522  NA 136  K 3.3*  CL 102  CO2 26  GLUCOSE 145*  BUN 23*  CREATININE 0.94  CALCIUM 9.5   No results for input(s): LABPT, INR in the last 72 hours. No results for input(s): LABURIN in the last 72 hours. Results for orders placed or performed during the hospital encounter of 08/16/16  Urine culture     Status: Abnormal   Collection Time: 08/16/16  1:19 PM  Result Value Ref Range Status   Specimen Description URINE, RANDOM  Final   Special Requests NONE  Final   Culture MULTIPLE SPECIES PRESENT, SUGGEST  RECOLLECTION (A)  Final   Report Status 08/17/2016 FINAL  Final    Studies/Results: Dg Abd 1 View  Result Date: 07/14/2017 CLINICAL DATA:  Lithotripsy. EXAM: ABDOMEN - 1 VIEW COMPARISON:  CT 07/07/2017. FINDINGS: Soft tissue structures are unremarkable. 1.3 cm right renal/UPJ stone again noted. Abdominopelvic calcifications again noted consistent phleboliths. No bowel distention. No acute bony abnormality . IMPRESSION: Right renal UPJ stone again noted. Similar findings noted on prior exam . 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  Plan: Adjust pain medication to IV Tylenol and toradol alternating every three hours.  Dilaudid PRN Added flomax to regimen Increased IV fluids Home medications NPO p MN for stent  in AM if pain not improved KUB in  AM.  Louis Meckel, MD Urology 07/16/2017, 9:20 AM

## 2017-07-17 ENCOUNTER — Inpatient Hospital Stay (HOSPITAL_COMMUNITY): Payer: Medicare Other | Admitting: Registered Nurse

## 2017-07-17 ENCOUNTER — Encounter (HOSPITAL_COMMUNITY): Payer: Self-pay | Admitting: Registered Nurse

## 2017-07-17 ENCOUNTER — Inpatient Hospital Stay (HOSPITAL_COMMUNITY): Payer: Medicare Other

## 2017-07-17 ENCOUNTER — Encounter (HOSPITAL_COMMUNITY)
Admission: AD | Disposition: A | Discharge status: Home / Self Care | Payer: Self-pay | Source: Ambulatory Visit | Attending: Urology

## 2017-07-17 DIAGNOSIS — N201 Calculus of ureter: Secondary | ICD-10-CM | POA: Diagnosis present

## 2017-07-17 HISTORY — PX: CYSTOSCOPY/RETROGRADE/URETEROSCOPY/STONE EXTRACTION WITH BASKET: SHX5317

## 2017-07-17 LAB — SURGICAL PCR SCREEN
MRSA, PCR: NEGATIVE
STAPHYLOCOCCUS AUREUS: NEGATIVE

## 2017-07-17 SURGERY — CYSTOSCOPY, WITH CALCULUS REMOVAL USING BASKET
Anesthesia: General | Site: Ureter | Laterality: Right

## 2017-07-17 MED ORDER — LISINOPRIL 10 MG PO TABS
10.0000 mg | ORAL_TABLET | Freq: Every day | ORAL | Status: DC
  Administered 2017-07-17 – 2017-07-19 (×3): 10 mg via ORAL
  Filled 2017-07-17 (×3): qty 1

## 2017-07-17 MED ORDER — PROMETHAZINE HCL 25 MG/ML IJ SOLN: 6.2500 mg | INTRAMUSCULAR | Status: DC | PRN

## 2017-07-17 MED ORDER — MIDAZOLAM HCL 2 MG/2ML IJ SOLN
INTRAMUSCULAR | Status: AC
  Filled 2017-07-17: qty 2

## 2017-07-17 MED ORDER — DEXAMETHASONE SODIUM PHOSPHATE 10 MG/ML IJ SOLN
INTRAMUSCULAR | Status: DC | PRN
  Administered 2017-07-17: 10 mg via INTRAVENOUS

## 2017-07-17 MED ORDER — ONDANSETRON HCL 4 MG/2ML IJ SOLN
INTRAMUSCULAR | Status: AC
  Filled 2017-07-17: qty 2

## 2017-07-17 MED ORDER — MIDAZOLAM HCL 5 MG/5ML IJ SOLN
INTRAMUSCULAR | Status: DC | PRN
  Administered 2017-07-17: 2 mg via INTRAVENOUS

## 2017-07-17 MED ORDER — LIDOCAINE HCL (CARDIAC) 10 MG/ML IV SOLN
INTRAVENOUS | Status: DC | PRN
  Administered 2017-07-17: 75 mg via INTRAVENOUS

## 2017-07-17 MED ORDER — DEXTROSE 5 % IV SOLN
2.0000 g | INTRAVENOUS | Status: DC
  Administered 2017-07-17 – 2017-07-18 (×2): 2 g via INTRAVENOUS
  Filled 2017-07-17 (×3): qty 2

## 2017-07-17 MED ORDER — DEXTROSE 5 % IV SOLN
INTRAVENOUS | Status: AC
  Filled 2017-07-17: qty 2

## 2017-07-17 MED ORDER — PROPOFOL 10 MG/ML IV BOLUS
INTRAVENOUS | Status: DC | PRN
  Administered 2017-07-17: 200 mg via INTRAVENOUS

## 2017-07-17 MED ORDER — PANTOPRAZOLE SODIUM 40 MG PO TBEC
40.0000 mg | DELAYED_RELEASE_TABLET | Freq: Every day | ORAL | Status: DC
  Administered 2017-07-17 – 2017-07-19 (×3): 40 mg via ORAL
  Filled 2017-07-17 (×3): qty 1

## 2017-07-17 MED ORDER — LACTATED RINGERS IV SOLN
INTRAVENOUS | Status: DC | PRN
  Administered 2017-07-17: 10:00:00 via INTRAVENOUS

## 2017-07-17 MED ORDER — ZOLPIDEM TARTRATE 5 MG PO TABS
5.0000 mg | ORAL_TABLET | Freq: Every evening | ORAL | Status: DC | PRN
  Filled 2017-07-17: qty 1

## 2017-07-17 MED ORDER — FENTANYL CITRATE (PF) 250 MCG/5ML IJ SOLN
INTRAMUSCULAR | Status: AC
  Filled 2017-07-17: qty 5

## 2017-07-17 MED ORDER — SCOPOLAMINE 1 MG/3DAYS TD PT72
MEDICATED_PATCH | TRANSDERMAL | Status: AC
  Administered 2017-07-17: 10:00:00
  Filled 2017-07-17: qty 1

## 2017-07-17 MED ORDER — DIPHENHYDRAMINE HCL 50 MG/ML IJ SOLN: 12.5000 mg | Freq: Four times a day (QID) | INTRAMUSCULAR | Status: DC | PRN

## 2017-07-17 MED ORDER — VENLAFAXINE HCL 37.5 MG PO TABS: 37.5000 mg | ORAL_TABLET | Freq: Every day | ORAL | Status: DC

## 2017-07-17 MED ORDER — HYOSCYAMINE SULFATE 0.125 MG SL SUBL
0.1250 mg | SUBLINGUAL_TABLET | SUBLINGUAL | Status: DC | PRN
  Administered 2017-07-17 – 2017-07-18 (×3): 0.125 mg via SUBLINGUAL
  Filled 2017-07-17 (×3): qty 1

## 2017-07-17 MED ORDER — ASPIRIN EC 81 MG PO TBEC
81.0000 mg | DELAYED_RELEASE_TABLET | Freq: Every day | ORAL | Status: DC
  Administered 2017-07-17 – 2017-07-19 (×3): 81 mg via ORAL
  Filled 2017-07-17 (×3): qty 1

## 2017-07-17 MED ORDER — OXYCODONE HCL 5 MG PO TABS
10.0000 mg | ORAL_TABLET | ORAL | Status: DC | PRN
  Administered 2017-07-18 – 2017-07-19 (×5): 10 mg via ORAL
  Filled 2017-07-17 (×6): qty 2

## 2017-07-17 MED ORDER — LIDOCAINE 2% (20 MG/ML) 5 ML SYRINGE
INTRAMUSCULAR | Status: AC
  Filled 2017-07-17: qty 5

## 2017-07-17 MED ORDER — DIPHENHYDRAMINE HCL 12.5 MG/5ML PO ELIX: 12.5000 mg | ORAL_SOLUTION | Freq: Four times a day (QID) | ORAL | Status: DC | PRN

## 2017-07-17 MED ORDER — DEXAMETHASONE SODIUM PHOSPHATE 10 MG/ML IJ SOLN
INTRAMUSCULAR | Status: AC
  Filled 2017-07-17: qty 1

## 2017-07-17 MED ORDER — TAMSULOSIN HCL 0.4 MG PO CAPS: 0.4000 mg | ORAL_CAPSULE | Freq: Every day | ORAL | Status: DC

## 2017-07-17 MED ORDER — BELLADONNA ALKALOIDS-OPIUM 16.2-60 MG RE SUPP: 1.0000 | Freq: Once | RECTAL | Status: DC

## 2017-07-17 MED ORDER — SCOPOLAMINE 1 MG/3DAYS TD PT72
MEDICATED_PATCH | TRANSDERMAL | Status: DC | PRN
  Administered 2017-07-17: 1 via TRANSDERMAL

## 2017-07-17 MED ORDER — HYDROMORPHONE HCL-NACL 0.5-0.9 MG/ML-% IV SOSY
0.5000 mg | PREFILLED_SYRINGE | INTRAVENOUS | Status: DC | PRN
  Administered 2017-07-17: 0.5 mg via INTRAVENOUS
  Administered 2017-07-17 (×3): 1 mg via INTRAVENOUS
  Administered 2017-07-18 (×3): 0.5 mg via INTRAVENOUS
  Administered 2017-07-18 – 2017-07-19 (×3): 1 mg via INTRAVENOUS
  Filled 2017-07-17: qty 2
  Filled 2017-07-17: qty 1
  Filled 2017-07-17: qty 2
  Filled 2017-07-17: qty 1
  Filled 2017-07-17: qty 2
  Filled 2017-07-17: qty 1
  Filled 2017-07-17 (×2): qty 2
  Filled 2017-07-17: qty 1
  Filled 2017-07-17: qty 2

## 2017-07-17 MED ORDER — SODIUM CHLORIDE 0.9 % IV SOLN
INTRAVENOUS | Status: DC
  Administered 2017-07-17 – 2017-07-18 (×3): via INTRAVENOUS

## 2017-07-17 MED ORDER — FENTANYL CITRATE (PF) 100 MCG/2ML IJ SOLN
INTRAMUSCULAR | Status: DC | PRN
  Administered 2017-07-17: 50 ug via INTRAVENOUS
  Administered 2017-07-17: 100 ug via INTRAVENOUS
  Administered 2017-07-17: 50 ug via INTRAVENOUS

## 2017-07-17 MED ORDER — FENTANYL CITRATE (PF) 100 MCG/2ML IJ SOLN: 25.0000 ug | INTRAMUSCULAR | Status: DC | PRN

## 2017-07-17 MED ORDER — FLUTICASONE PROPIONATE 50 MCG/ACT NA SUSP
2.0000 | Freq: Every day | NASAL | Status: DC | PRN
Start: 2017-07-17 — End: 2017-07-19
  Filled 2017-07-17: qty 16

## 2017-07-17 MED ORDER — ONDANSETRON HCL 4 MG/2ML IJ SOLN
4.0000 mg | INTRAMUSCULAR | Status: DC | PRN
  Administered 2017-07-18: 4 mg via INTRAVENOUS
  Filled 2017-07-17: qty 2

## 2017-07-17 MED ORDER — HYDROCHLOROTHIAZIDE 25 MG PO TABS
50.0000 mg | ORAL_TABLET | Freq: Every morning | ORAL | Status: DC
  Administered 2017-07-17 – 2017-07-19 (×3): 50 mg via ORAL
  Filled 2017-07-17 (×3): qty 2

## 2017-07-17 MED ORDER — SODIUM CHLORIDE 0.9 % IR SOLN
Status: DC | PRN
  Administered 2017-07-17: 1000 mL
  Administered 2017-07-17: 3000 mL

## 2017-07-17 MED ORDER — LINACLOTIDE 145 MCG PO CAPS
145.0000 ug | ORAL_CAPSULE | Freq: Every day | ORAL | Status: DC
  Administered 2017-07-18 – 2017-07-19 (×2): 145 ug via ORAL
  Filled 2017-07-17 (×2): qty 1

## 2017-07-17 MED ORDER — ONDANSETRON HCL 4 MG/2ML IJ SOLN
INTRAMUSCULAR | Status: DC | PRN
  Administered 2017-07-17: 4 mg via INTRAVENOUS

## 2017-07-17 MED ORDER — DOCUSATE SODIUM 100 MG PO CAPS: 100.0000 mg | ORAL_CAPSULE | Freq: Every day | ORAL | Status: DC | PRN

## 2017-07-17 MED ORDER — PROPOFOL 10 MG/ML IV BOLUS
INTRAVENOUS | Status: AC
  Filled 2017-07-17: qty 20

## 2017-07-17 MED ORDER — IOHEXOL 300 MG/ML  SOLN
INTRAMUSCULAR | Status: DC | PRN
  Administered 2017-07-17: 10 mL

## 2017-07-17 SURGICAL SUPPLY — 29 items
BAG URO CATCHER STRL LF (MISCELLANEOUS) ×5 IMPLANT
BASKET DAKOTA 1.9FR 11X120 (BASKET) IMPLANT
BASKET LASER NITINOL 1.9FR (BASKET) IMPLANT
BSKT STON RTRVL 120 1.9FR (BASKET)
CATH FOLEY LATEX FREE 20FR (CATHETERS)
CATH FOLEY LF 20FR (CATHETERS) IMPLANT
CATH INTERMIT  6FR 70CM (CATHETERS) ×5 IMPLANT
CLOTH BEACON ORANGE TIMEOUT ST (SAFETY) ×5 IMPLANT
COVER FOOTSWITCH UNIV (MISCELLANEOUS) IMPLANT
COVER SURGICAL LIGHT HANDLE (MISCELLANEOUS) ×8 IMPLANT
EXTRACTOR STONE NITINOL NGAGE (UROLOGICAL SUPPLIES) ×3 IMPLANT
FIBER LASER TRAC TIP (UROLOGICAL SUPPLIES) IMPLANT
GLOVE BIO SURGEON STRL SZ8 (GLOVE) ×5 IMPLANT
GOWN STRL REUS W/TWL XL LVL3 (GOWN DISPOSABLE) ×8 IMPLANT
GUIDEWIRE ANG ZIPWIRE 038X150 (WIRE) ×5 IMPLANT
GUIDEWIRE STR DUAL SENSOR (WIRE) ×5 IMPLANT
IV NS 1000ML (IV SOLUTION) ×4
IV NS 1000ML BAXH (IV SOLUTION) ×3 IMPLANT
MANIFOLD NEPTUNE II (INSTRUMENTS) ×5 IMPLANT
PACK CYSTO (CUSTOM PROCEDURE TRAY) ×5 IMPLANT
SHEATH ACCESS URETERAL 38CM (SHEATH) IMPLANT
STENT CONTOUR 6FRX26X.038 (STENTS) IMPLANT
STENT POLARIS 5FRX24 (STENTS) IMPLANT
STENT POLARIS 5FRX26 (STENTS) ×3 IMPLANT
SYR CONTROL 10ML LL (SYRINGE) IMPLANT
SYRINGE IRR TOOMEY STRL 70CC (SYRINGE) IMPLANT
TUBE FEEDING 8FR 16IN STR KANG (MISCELLANEOUS) IMPLANT
TUBING CONNECTING 10 (TUBING) ×4 IMPLANT
TUBING CONNECTING 10' (TUBING) ×1

## 2017-07-17 NOTE — Plan of Care (Signed)
Problem: Pain Managment: Goal: General experience of comfort will improve Outcome: Not Progressing Patient having excruciating pain and spasm especially with activity

## 2017-07-17 NOTE — Transfer of Care (Signed)
Immediate Anesthesia Transfer of Care Note  Patient: [name]  Procedure(s) Performed: Procedure(s): CYSTOSCOPY/RIGHT RETROGRADE/RIGHT URETEROSCOPY/STONE EXTRACTION WITH BASKET/LASER LITHOTRIPSY (Right)  Patient Location: PACU  Anesthesia Type:General  Level of Consciousness: awake, alert , oriented and patient cooperative  Airway & Oxygen Therapy: Patient Spontanous Breathing and Patient connected to face mask oxygen  Post-op Assessment: Report given to RN, Post -op Vital signs reviewed and stable and Patient moving all extremities X 4  Post vital signs: stable  Last Vitals:  Vitals:   07/17/17 1115 07/17/17 1130  BP: (!) 153/80 (!) 152/85  Pulse: 74 65  Resp: 12 15  Temp:    SpO2: 100% 100%    Last Pain:  Vitals:   07/17/17 1115  TempSrc:   PainSc: Asleep      Patients Stated Pain Goal: 3 (41/66/06 3016)  Complications: No apparent anesthesia complications

## 2017-07-17 NOTE — Anesthesia Postprocedure Evaluation (Signed)
Anesthesia Post Note  Patient: RACHNA SCHONBERGER  Procedure(s) Performed: Procedure(s) (LRB): CYSTOSCOPY/RIGHT RETROGRADE/RIGHT URETEROSCOPY/STONE EXTRACTION WITH BASKET/LASER LITHOTRIPSY (Right)     Patient location during evaluation: PACU Anesthesia Type: General Level of consciousness: awake and alert Pain management: pain level controlled Vital Signs Assessment: post-procedure vital signs reviewed and stable Respiratory status: spontaneous breathing, nonlabored ventilation, respiratory function stable and patient connected to nasal cannula oxygen Cardiovascular status: blood pressure returned to baseline and stable Postop Assessment: no signs of nausea or vomiting Anesthetic complications: no    Last Vitals:  Vitals:   07/17/17 1225 07/17/17 2010  BP: (!) 142/74 126/66  Pulse: 64 88  Resp: 16 20  Temp: 36.9 C 36.7 C  SpO2: 97% 98%    Last Pain:  Vitals:   07/17/17 2010  TempSrc: Oral  PainSc:                  Aldon Hengst S

## 2017-07-17 NOTE — H&P (View-Only) (Signed)
Urology Inpatient Progress Report  Right flank pain right ureteral stone    Intv/Subj: Pain still significant - using IV dilaudid this AM Complaining of nausea as well Afebrile  Active Problems:   Calculus, renal  Current Facility-Administered Medications  Medication Dose Route Frequency Provider Last Rate Last Dose  . 0.45 % sodium chloride infusion   Intravenous Continuous Ardis Hughs, MD 75 mL/hr at 07/16/17 [phone]    . acetaminophen (OFIRMEV) IV 1,000 mg  1,000 mg Intravenous Q6H Ardis Hughs, MD      . cephALEXin [facility]) capsule 500 mg  500 mg Oral TID Ardis Hughs, MD   500 mg at 07/15/17 2131  . HYDROmorphone (DILAUDID) injection 0.5-1 mg  0.5-1 mg Intravenous Q2H PRN Franchot Gallo, MD   1 mg at 07/16/17 0842  . ketorolac (TORADOL) 15 MG/ML injection 15 mg  15 mg Intravenous Q6H PRN Ardis Hughs, MD   15 mg at 07/16/17 0255  . ondansetron (ZOFRAN) injection 4 mg  4 mg Intravenous Q4H PRN Franchot Gallo, MD   4 mg at 07/16/17 0303  . oxybutynin (DITROPAN) tablet 5 mg  5 mg Oral Q8H PRN Franchot Gallo, MD      . oxyCODONE (Oxy IR/ROXICODONE) immediate release tablet 5 mg  5 mg Oral Q4H PRN Franchot Gallo, MD      . promethazine (PHENERGAN) tablet 12.5 mg  12.5 mg Oral Q4H PRN Franchot Gallo, MD       Or  . promethazine (PHENERGAN) injection 12.5 mg  12.5 mg Intravenous Q6H PRN Franchot Gallo, MD   12.5 mg at 07/15/17 1504  . senna (SENOKOT) tablet 8.6 mg  1 tablet Oral BID Franchot Gallo, MD   8.6 mg at 07/15/17 2131  . tamsulosin (FLOMAX) capsule 0.4 mg  0.4 mg Oral Daily Ardis Hughs, MD      . venlafaxine XR [facility]) 24 hr capsule 37.5 mg  37.5 mg Oral QHS Ardis Hughs, MD   37.5 mg at 07/15/17 2149     Objective: Vital: Vitals:   07/15/17 1443 07/15/17 2016 07/15/17 2201 07/16/17 0526  BP: (!) 168/73  127/73 106/65  Pulse: 79  69 81  Resp: 20  16 (!) 24  Temp: 98.6 F (37 C)  98.5 F (36.9 C)  (!) 97.5 F (36.4 C)  TempSrc: Oral  Oral Oral  SpO2: 99%  95% 97%  Weight:  102.6 kg (226 lb 3.1 oz)    Height:  5\' 7"  (1.702 m)     I/Os: I/O last 3 completed shifts: In: 1125 [I.V.:1125] Out: 400 [Urine:400]  Physical Exam:  General: Patient is in no apparent distress Lungs: Normal respiratory effort, chest expands symmetrically. GI: right CVA and abdominal tenderness Ext: lower extremities symmetric  Lab Results:  Recent Labs  07/15/17 1522  HGB 14.3  HCT 41.5    Recent Labs  07/15/17 1522  NA 136  K 3.3*  CL 102  CO2 26  GLUCOSE 145*  BUN 23*  CREATININE 0.94  CALCIUM 9.5   No results for input(s): LABPT, INR in the last 72 hours. No results for input(s): LABURIN in the last 72 hours. Results for orders placed or performed during the hospital encounter of 08/16/16  Urine culture     Status: Abnormal   Collection Time: 08/16/16  1:19 PM  Result Value Ref Range Status   Specimen Description URINE, RANDOM  Final   Special Requests NONE  Final   Culture MULTIPLE SPECIES PRESENT, SUGGEST  RECOLLECTION (A)  Final   Report Status 08/17/2016 FINAL  Final    Studies/Results: Dg Abd 1 View  Result Date: 07/14/2017 CLINICAL DATA:  Lithotripsy. EXAM: ABDOMEN - 1 VIEW COMPARISON:  CT 07/07/2017. FINDINGS: Soft tissue structures are unremarkable. 1.3 cm right renal/UPJ stone again noted. Abdominopelvic calcifications again noted consistent phleboliths. No bowel distention. No acute bony abnormality . IMPRESSION: Right renal UPJ stone again noted. Similar findings noted on prior exam . 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  Plan: Adjust pain medication to IV Tylenol and toradol alternating every three hours.  Dilaudid PRN Added flomax to regimen Increased IV fluids Home medications NPO p MN for stent  in AM if pain not improved KUB in  AM.  Louis Meckel, MD Urology 07/16/2017, 9:20 AM

## 2017-07-17 NOTE — Op Note (Signed)
Preoperative diagnosis: Right ureteral calculi  Postoperative diagnosis: Same  Procedure: 1 cystoscopy 2 right retrograde pyelography 3.  Intraoperative fluoroscopy, under one hour, with interpretation 4.  Right ureteroscopic stone manipulation with laser lithotripsy 5.  Right 5 x 26 JJ stent placement  Attending: Rosie Fate  Anesthesia: General  Estimated blood loss: None  Drains: Right 5 x 26 JJ ureteral stent without tether  Specimens: stone for analysis  Antibiotics: rocephin  Findings: numerous distal and mid ureteral calculi. Moderate hydronephrosis.  Indications: Patient is a 56 year old female with a history of ureteral calculus who underwent ESWL. She failed to pass the fragments and had intractable pain. After discussing treatment options, she decided proceed with right ureteroscopic stone manipulation.  Procedure her in detail: The patient was brought to the operating room and a brief timeout was done to ensure correct patient, correct procedure, correct site.  General anesthesia was administered patient was placed in dorsal lithotomy position.  Her genitalia was then prepped and draped in usual sterile fashion.  A rigid 28 French cystoscope was passed in the urethra and the bladder.  Bladder was inspected free masses or lesions.  the ureteral orifices were in the normal orthotopic locations.  a 6 french ureteral catheter was then instilled into the right ureter orifice.  a gentle retrograde was obtained and findings noted above.  we then placed a zip wire through the ureteral catheter and advanced up to the renal pelvis.  we then removed the cystoscope and cannulated the right ureteral orifice with a semirigid ureteroscope.  we then encountered numerous calculi in the distal and mid ureter.  using a 200 nm laser fiber and fragmented the stone into smaller pieces.  the pieces were then removed with a Ngage basket.  once all stone fragments were removed we then placed a 5 x  26 double-j ureteral stent over the original zip wire. We then removed the wire and good coil was noted in the the renal pelvis under fluoroscopy and the bladder under direct vision.     the stone fragments were then removed from the bladder and sent for analysis.   the bladder was then drained and this concluded the procedure which was well tolerated by patient.  Complications: None  Condition: Stable, extubated, transferred to PACU  Plan: Patient is to be admitted for overnight observation and discharged home tomorrow with follow-up in one week for stent removal.

## 2017-07-17 NOTE — Interval H&P Note (Signed)
History and Physical Interval Note:  07/17/2017 9:42 AM  Amanda Willis  has presented today for surgery, with the diagnosis of right ureteral obstruction  The various methods of treatment have been discussed with the patient and family. After consideration of risks, benefits and other options for treatment, the patient has consented to  Procedure(s): CYSTOSCOPY/RETROGRADE/URETEROSCOPY/STONE EXTRACTION WITH BASKET as a surgical intervention .  The patient's history has been reviewed, patient examined, no change in status, stable for surgery.  I have reviewed the patient's chart and labs.  Questions were answered to the patient's satisfaction.     Nicolette Bang

## 2017-07-17 NOTE — Anesthesia Procedure Notes (Signed)
Procedure Name: LMA Insertion Date/Time: 07/17/2017 10:00 AM Performed by: Enrigue Catena E Pre-anesthesia Checklist: Patient identified, Emergency Drugs available, Suction available and Patient being monitored Patient Re-evaluated:Patient Re-evaluated prior to induction Oxygen Delivery Method: Circle system utilized Preoxygenation: Pre-oxygenation with 100% oxygen Induction Type: IV induction Ventilation: Mask ventilation without difficulty and Oral airway inserted - appropriate to patient size LMA: LMA with gastric port inserted LMA Size: 4.0 Tube type: Oral Number of attempts: 1 Airway Equipment and Method: Oral airway Placement Confirmation: positive ETCO2 Tube secured with: Tape Dental Injury: Teeth and Oropharynx as per pre-operative assessment

## 2017-07-17 NOTE — Anesthesia Preprocedure Evaluation (Signed)
Anesthesia Evaluation  Patient identified by MRN, date of birth, ID band Patient awake    Reviewed: Allergy & Precautions, NPO status , Patient's Chart, lab work & pertinent test results  History of Anesthesia Complications (+) PONV  Airway Mallampati: II  TM Distance: >3 FB Neck ROM: Full    Dental no notable dental hx.    Pulmonary neg pulmonary ROS,    Pulmonary exam normal breath sounds clear to auscultation       Cardiovascular hypertension, Normal cardiovascular exam Rhythm:Regular Rate:Normal     Neuro/Psych negative neurological ROS  negative psych ROS   GI/Hepatic Neg liver ROS, GERD  Medicated,  Endo/Other  obesity  Renal/GU negative Renal ROS  negative genitourinary   Musculoskeletal negative musculoskeletal ROS (+)   Abdominal   Peds negative pediatric ROS (+)  Hematology negative hematology ROS (+)   Anesthesia Other Findings   Reproductive/Obstetrics negative OB ROS                             Anesthesia Physical Anesthesia Plan  ASA: II  Anesthesia Plan: General   Post-op Pain Management:    Induction: Intravenous  PONV Risk Score and Plan: 3 and Ondansetron, Dexamethasone and Midazolam  Airway Management Planned: LMA  Additional Equipment:   Intra-op Plan:   Post-operative Plan:   Informed Consent: I have reviewed the patients History and Physical, chart, labs and discussed the procedure including the risks, benefits and alternatives for the proposed anesthesia with the patient or authorized representative who has indicated his/her understanding and acceptance.   Dental advisory given  Plan Discussed with: CRNA and Surgeon  Anesthesia Plan Comments:         Anesthesia Quick Evaluation

## 2017-07-18 ENCOUNTER — Encounter (HOSPITAL_COMMUNITY): Payer: Self-pay | Admitting: Urology

## 2017-07-18 MED ORDER — PHENAZOPYRIDINE HCL 200 MG PO TABS
200.0000 mg | ORAL_TABLET | Freq: Three times a day (TID) | ORAL | Status: DC
  Administered 2017-07-18 – 2017-07-19 (×3): 200 mg via ORAL
  Filled 2017-07-18 (×4): qty 1

## 2017-07-18 MED ORDER — MIRABEGRON ER 25 MG PO TB24
25.0000 mg | ORAL_TABLET | Freq: Every day | ORAL | Status: DC
  Administered 2017-07-18 – 2017-07-19 (×2): 25 mg via ORAL
  Filled 2017-07-18 (×2): qty 1

## 2017-07-18 MED ORDER — KETOROLAC TROMETHAMINE 15 MG/ML IJ SOLN
15.0000 mg | Freq: Four times a day (QID) | INTRAMUSCULAR | Status: DC
Start: 2017-07-18 — End: 2017-07-19
  Administered 2017-07-18 – 2017-07-19 (×4): 15 mg via INTRAVENOUS
  Filled 2017-07-18 (×4): qty 1

## 2017-07-18 MED ORDER — TAMSULOSIN HCL 0.4 MG PO CAPS: 0.4000 mg | ORAL_CAPSULE | Freq: Every day | ORAL | 0 refills | Status: AC

## 2017-07-18 MED ORDER — OXYCODONE-ACETAMINOPHEN 10-325 MG PO TABS: 1.0000 | ORAL_TABLET | ORAL | 0 refills | Status: AC | PRN

## 2017-07-19 ENCOUNTER — Encounter (HOSPITAL_COMMUNITY): Payer: Self-pay | Admitting: Urology

## 2017-07-19 MED ORDER — MIRABEGRON ER 25 MG PO TB24: 25.0000 mg | ORAL_TABLET | Freq: Every day | ORAL | 0 refills | Status: AC

## 2017-07-19 MED ORDER — PHENAZOPYRIDINE HCL 200 MG PO TABS: 200.0000 mg | ORAL_TABLET | Freq: Three times a day (TID) | ORAL | 0 refills | Status: AC | PRN

## 2017-07-19 MED ORDER — HYOSCYAMINE SULFATE 0.125 MG SL SUBL: 0.1250 mg | SUBLINGUAL_TABLET | SUBLINGUAL | 0 refills | Status: AC | PRN

## 2017-07-19 NOTE — Care Management Note (Signed)
Case Management Note  Patient Details  Name: Amanda Willis MRN: [id number] Date of Birth: 16-May-1961  Subjective/Objective:56 y/o f admitted w/Renal calculi. From home.                    Action/Plan:d/c home.   Expected Discharge Date:  07/19/17               Expected Discharge Plan:  Home/Self Care  In-House Referral:     Discharge planning Services  CM Consult  Post Acute Care Choice:    Choice offered to:     DME Arranged:    DME Agency:     HH Arranged:    HH Agency:     Status of Service:  Completed, signed off  If discussed at [employer] of Stay Meetings, dates discussed:    Additional Comments:  Dessa Phi, RN 07/19/2017, 8:47 AM

## 2017-07-19 NOTE — Discharge Instructions (Signed)

## 2017-07-22 ENCOUNTER — Other Ambulatory Visit: Payer: Self-pay | Admitting: Urology

## 2017-07-23 NOTE — Discharge Summary (Signed)
Physician Discharge Summary  Patient ID: Amanda Willis MRN: [id number] DOB/AGE: [birth year] 56 y.o.  Admit date: 07/15/2017 Discharge date: 07/19/2017  Admission Diagnoses: Ureteral calculus, intractable flank pain  Discharge Diagnoses:  Active Problems:   Calculus, renal   Renal calculus   Ureteral calculus   Discharged Condition: good  Hospital Course: The patient was admitted after ESWl with intractable flank pain with numerous ureteral calculi. She failed to pass the pieces and was taken to the OR for ureteroscopic stone extraction.  The patient tolerated the procedure well and was transferred to the floor on IV pain meds, IV fluid.  pt was started on regular diet and they ambulated in the halls. On POD#2 the patient was transitioned to a regular diet, IVFs were discontinued, and the patient passed flatus. Prior to discharge the pt was tolerating a regular diet, pain was controlled on PO pain meds, they were ambulating without difficulty, and they had normal bowel function.   Consults: None  Significant Diagnostic Studies: none  Treatments: analgesia: Dilaudid and surgery: right ureteroscopic stone extraction  Discharge Exam: Blood pressure 115/72, pulse (!) 57, temperature 98.3 F (36.8 C), temperature source Oral, resp. rate 18, height 5\' 7"  (1.702 m), weight 102.6 kg (226 lb 3.1 oz), SpO2 96 %. General appearance: alert, cooperative and appears stated age Eyes: conjunctivae/corneas clear. PERRL, EOM's intact. Fundi benign. Nose: Nares normal. Septum midline. Mucosa normal. No drainage or sinus tenderness. Neck: no adenopathy, no carotid bruit, no JVD, supple, symmetrical, trachea midline and thyroid not enlarged, symmetric, no tenderness/mass/nodules Resp: clear to auscultation bilaterally Cardio: regular rate and rhythm, S1, S2 normal, no murmur, click, rub or gallop GI: soft, non-tender; bowel sounds normal; no masses,  no organomegaly Extremities: extremities normal,  atraumatic, no cyanosis or edema Neurologic: Grossly normal  Disposition: 01-Home or Self Care  Discharge Instructions    Discharge patient    Complete by:  As directed    Discharge disposition:  01-Home or Self Care   Discharge patient date:  07/19/2017     Allergies as of 07/19/2017      Reactions   Morphine And Related Shortness Of Breath, Itching, Rash   Reglan [metoclopramide] Anxiety      Medication List    STOP taking these medications   cephALEXin 500 MG capsule Commonly known as:  KEFLEX   oxyCODONE 5 MG immediate release tablet Commonly known as:  Oxy IR/ROXICODONE     TAKE these medications   aspirin EC 81 MG tablet Take 81 mg by mouth daily.   DSS 100 MG Caps Take 100 mg by mouth daily. What changed:  when to take this  reasons to take this   esomeprazole 40 MG capsule Commonly known as:  NEXIUM Take 40 mg by mouth daily before breakfast.   fluticasone 50 MCG/ACT nasal spray Commonly known as:  FLONASE Place 2 sprays into both nostrils daily as needed for rhinitis or allergies.   hydrochlorothiazide 50 MG tablet Commonly known as:  HYDRODIURIL Take 50 mg by mouth every morning.   hyoscyamine 0.125 MG SL tablet Commonly known as:  LEVSIN SL Place 1 tablet (0.125 mg total) under the tongue every 4 (four) hours as needed (bladder spasms).   LINZESS 145 MCG Caps capsule Generic drug:  linaclotide Take 145 mcg by mouth daily before breakfast.   lisinopril 10 MG tablet Commonly known as:  PRINIVIL,ZESTRIL Take 10 mg by mouth daily.   mirabegron ER 25 MG Tb24 tablet Commonly known as:  MYRBETRIQ Take  1 tablet (25 mg total) by mouth daily.   ondansetron 4 MG tablet Commonly known as:  ZOFRAN TAKE 1 TABLET EVER 6 HOURS AS NEEDED FOR NAUSEA AND VOMITING   oxyCODONE-acetaminophen 10-325 MG tablet Commonly known as:  PERCOCET Take 1 tablet by mouth every 4 (four) hours as needed for pain.   phenazopyridine 200 MG tablet Commonly known as:   PYRIDIUM Take 1 tablet (200 mg total) by mouth 3 (three) times daily as needed for pain.   tamsulosin 0.4 MG Caps capsule Commonly known as:  FLOMAX Take 1 capsule (0.4 mg total) by mouth daily.   traMADol 50 MG tablet Commonly known as:  ULTRAM Take 1 tablet (50 mg total) by mouth every 6 (six) hours as needed.   venlafaxine 37.5 MG tablet Commonly known as:  EFFEXOR Take 37.5 mg by mouth at bedtime.            Discharge Care Instructions        Start     Ordered   07/19/17 0000  hyoscyamine (LEVSIN SL) 0.125 MG SL tablet  Every 4 hours PRN     07/19/17 0737   07/19/17 0000  mirabegron ER (MYRBETRIQ) 25 MG TB24 tablet  Daily     07/19/17 0737   07/19/17 0000  phenazopyridine (PYRIDIUM) 200 MG tablet  3 times daily PRN     07/19/17 0737   07/19/17 0000  Discharge patient    Question Answer Comment  Discharge disposition 01-Home or Self Care   Discharge patient date 07/19/2017      07/19/17 0737   07/18/17 0000  oxyCODONE-acetaminophen (PERCOCET) 10-325 MG tablet  Every 4 hours PRN     07/18/17 1017   07/18/17 0000  tamsulosin (FLOMAX) 0.4 MG CAPS capsule  Daily     07/18/17 1017     Follow-up Information    Franchot Gallo, MD. Call on 07/22/2017.   Specialty:  Urology Why:  stent removal Contact information: [place] [address] [phone]           Signed: Nicolette Bang 07/23/2017, 4:11 PM

## 2017-07-25 ENCOUNTER — Other Ambulatory Visit: Payer: Self-pay | Admitting: Urology

## 2017-07-25 ENCOUNTER — Ambulatory Visit: Admit: 2017-07-25 | Payer: Medicare Other | Admitting: Urology

## 2017-07-25 ENCOUNTER — Encounter (HOSPITAL_BASED_OUTPATIENT_CLINIC_OR_DEPARTMENT_OTHER): Payer: Self-pay | Admitting: *Deleted

## 2017-07-25 SURGERY — CYSTOURETEROSCOPY, WITH RETROGRADE PYELOGRAM AND STENT INSERTION
Anesthesia: General | Laterality: Right

## 2017-07-26 ENCOUNTER — Encounter (HOSPITAL_BASED_OUTPATIENT_CLINIC_OR_DEPARTMENT_OTHER): Payer: Self-pay | Admitting: *Deleted

## 2017-07-26 NOTE — Progress Notes (Addendum)
To [facility] at 1045-Istat on arrival-Ekg with chart dated-Npo after Mn-pain med as need in am with water only.Having severe pain ,nausea and vomiting-stated [facility] is aware- instructed may arrive earlier if needed.

## 2017-07-27 ENCOUNTER — Ambulatory Visit (HOSPITAL_BASED_OUTPATIENT_CLINIC_OR_DEPARTMENT_OTHER): Payer: Medicare Other | Admitting: Anesthesiology

## 2017-07-27 ENCOUNTER — Encounter (HOSPITAL_BASED_OUTPATIENT_CLINIC_OR_DEPARTMENT_OTHER)
Admission: RE | Disposition: A | Discharge status: Home / Self Care | Payer: Self-pay | Source: Ambulatory Visit | Attending: Urology

## 2017-07-27 ENCOUNTER — Encounter (HOSPITAL_BASED_OUTPATIENT_CLINIC_OR_DEPARTMENT_OTHER): Payer: Self-pay | Admitting: Anesthesiology

## 2017-07-27 ENCOUNTER — Ambulatory Visit (HOSPITAL_BASED_OUTPATIENT_CLINIC_OR_DEPARTMENT_OTHER)
Admission: RE | Admit: 2017-07-27 | Discharge: 2017-07-27 | Disposition: A | Discharge status: Home / Self Care | Payer: Medicare Other | Source: Ambulatory Visit | Attending: Urology | Admitting: Urology

## 2017-07-27 DIAGNOSIS — G629 Polyneuropathy, unspecified: Secondary | ICD-10-CM | POA: Insufficient documentation

## 2017-07-27 DIAGNOSIS — K219 Gastro-esophageal reflux disease without esophagitis: Secondary | ICD-10-CM | POA: Diagnosis not present

## 2017-07-27 DIAGNOSIS — K449 Diaphragmatic hernia without obstruction or gangrene: Secondary | ICD-10-CM | POA: Diagnosis not present

## 2017-07-27 DIAGNOSIS — Z79899 Other long term (current) drug therapy: Secondary | ICD-10-CM | POA: Insufficient documentation

## 2017-07-27 DIAGNOSIS — Z90710 Acquired absence of both cervix and uterus: Secondary | ICD-10-CM | POA: Diagnosis not present

## 2017-07-27 DIAGNOSIS — T83122A Displacement of urinary stent, initial encounter: Secondary | ICD-10-CM | POA: Diagnosis not present

## 2017-07-27 DIAGNOSIS — Z7982 Long term (current) use of aspirin: Secondary | ICD-10-CM | POA: Diagnosis not present

## 2017-07-27 DIAGNOSIS — Z87442 Personal history of urinary calculi: Secondary | ICD-10-CM | POA: Diagnosis not present

## 2017-07-27 DIAGNOSIS — Y738 Miscellaneous gastroenterology and urology devices associated with adverse incidents, not elsewhere classified: Secondary | ICD-10-CM | POA: Insufficient documentation

## 2017-07-27 DIAGNOSIS — I10 Essential (primary) hypertension: Secondary | ICD-10-CM | POA: Diagnosis not present

## 2017-07-27 DIAGNOSIS — E785 Hyperlipidemia, unspecified: Secondary | ICD-10-CM | POA: Insufficient documentation

## 2017-07-27 HISTORY — DX: Displacement of indwelling ureteral stent, initial encounter: T83.122A

## 2017-07-27 HISTORY — PX: CYSTOSCOPY WITH URETEROSCOPY AND STENT PLACEMENT: SHX6377

## 2017-07-27 LAB — POCT I-STAT 4, (NA,K, GLUC, HGB,HCT)
Glucose, Bld: 116 mg/dL — ABNORMAL HIGH (ref 65–99)
HEMATOCRIT: 46 % (ref 36.0–46.0)
HEMOGLOBIN: 15.6 g/dL — AB (ref 12.0–15.0)
POTASSIUM: 3.4 mmol/L — AB (ref 3.5–5.1)
SODIUM: 140 mmol/L (ref 135–145)

## 2017-07-27 SURGERY — CYSTOURETEROSCOPY, WITH STENT INSERTION
Anesthesia: General | Site: Urethra | Laterality: Right

## 2017-07-27 MED ORDER — SUCCINYLCHOLINE CHLORIDE 20 MG/ML IJ SOLN
INTRAMUSCULAR | Status: DC | PRN
  Administered 2017-07-27: 120 mg via INTRAVENOUS

## 2017-07-27 MED ORDER — FENTANYL CITRATE (PF) 100 MCG/2ML IJ SOLN
INTRAMUSCULAR | Status: DC | PRN
  Administered 2017-07-27: 100 ug via INTRAVENOUS

## 2017-07-27 MED ORDER — DEXAMETHASONE SODIUM PHOSPHATE 4 MG/ML IJ SOLN
INTRAMUSCULAR | Status: DC | PRN
  Administered 2017-07-27: 10 mg via INTRAVENOUS

## 2017-07-27 MED ORDER — PROMETHAZINE HCL 25 MG/ML IJ SOLN
6.2500 mg | INTRAMUSCULAR | Status: DC | PRN
  Filled 2017-07-27: qty 1

## 2017-07-27 MED ORDER — SODIUM CHLORIDE 0.9 % IR SOLN
Status: DC | PRN
  Administered 2017-07-27: 1000 mL via INTRAVESICAL
  Administered 2017-07-27: 3000 mL via INTRAVESICAL

## 2017-07-27 MED ORDER — ONDANSETRON HCL 4 MG/2ML IJ SOLN
INTRAMUSCULAR | Status: AC
  Filled 2017-07-27: qty 2

## 2017-07-27 MED ORDER — MIDAZOLAM HCL 5 MG/5ML IJ SOLN
INTRAMUSCULAR | Status: DC | PRN
  Administered 2017-07-27: 2 mg via INTRAVENOUS

## 2017-07-27 MED ORDER — FENTANYL CITRATE (PF) 100 MCG/2ML IJ SOLN
25.0000 ug | INTRAMUSCULAR | Status: DC | PRN
  Filled 2017-07-27: qty 1

## 2017-07-27 MED ORDER — LIDOCAINE HCL (CARDIAC) 20 MG/ML IV SOLN
INTRAVENOUS | Status: DC | PRN
  Administered 2017-07-27: 100 mg via INTRAVENOUS

## 2017-07-27 MED ORDER — PROMETHAZINE HCL 25 MG/ML IJ SOLN
INTRAMUSCULAR | Status: AC
Start: 2017-07-27 — End: ?
  Filled 2017-07-27: qty 1

## 2017-07-27 MED ORDER — PROMETHAZINE HCL 25 MG/ML IJ SOLN
12.5000 mg | Freq: Once | INTRAMUSCULAR | Status: AC
  Administered 2017-07-27: 12.5 mg via INTRAVENOUS
  Filled 2017-07-27: qty 1

## 2017-07-27 MED ORDER — LACTATED RINGERS IV SOLN
INTRAVENOUS | Status: DC
  Administered 2017-07-27: 1000 mL via INTRAVENOUS
  Administered 2017-07-27 (×2): via INTRAVENOUS
  Filled 2017-07-27: qty 1000

## 2017-07-27 MED ORDER — SUCCINYLCHOLINE CHLORIDE 200 MG/10ML IV SOSY
PREFILLED_SYRINGE | INTRAVENOUS | Status: AC
  Filled 2017-07-27: qty 10

## 2017-07-27 MED ORDER — MIDAZOLAM HCL 2 MG/2ML IJ SOLN
INTRAMUSCULAR | Status: AC
  Filled 2017-07-27: qty 2

## 2017-07-27 MED ORDER — KETOROLAC TROMETHAMINE 30 MG/ML IJ SOLN
30.0000 mg | Freq: Once | INTRAMUSCULAR | Status: AC
  Administered 2017-07-27: 30 mg via INTRAVENOUS
  Filled 2017-07-27: qty 1

## 2017-07-27 MED ORDER — HYDROMORPHONE HCL-NACL 0.5-0.9 MG/ML-% IV SOSY
PREFILLED_SYRINGE | INTRAVENOUS | Status: AC
Start: 2017-07-27 — End: ?
  Filled 2017-07-27: qty 1

## 2017-07-27 MED ORDER — PROPOFOL 10 MG/ML IV BOLUS
INTRAVENOUS | Status: AC
  Filled 2017-07-27: qty 20

## 2017-07-27 MED ORDER — KETOROLAC TROMETHAMINE 30 MG/ML IJ SOLN
INTRAMUSCULAR | Status: AC
  Filled 2017-07-27: qty 1

## 2017-07-27 MED ORDER — ONDANSETRON HCL 4 MG/2ML IJ SOLN
INTRAMUSCULAR | Status: DC | PRN
  Administered 2017-07-27: 4 mg via INTRAVENOUS

## 2017-07-27 MED ORDER — CEFAZOLIN SODIUM-DEXTROSE 2-4 GM/100ML-% IV SOLN
2.0000 g | INTRAVENOUS | Status: AC
  Administered 2017-07-27: 2 g via INTRAVENOUS
  Filled 2017-07-27: qty 100

## 2017-07-27 MED ORDER — HYDROMORPHONE HCL 1 MG/ML IJ SOLN
1.0000 mg | INTRAMUSCULAR | Status: DC | PRN
  Administered 2017-07-27: 0.5 mg via INTRAVENOUS
  Filled 2017-07-27: qty 1

## 2017-07-27 MED ORDER — CEFAZOLIN SODIUM-DEXTROSE 2-4 GM/100ML-% IV SOLN
INTRAVENOUS | Status: AC
  Filled 2017-07-27: qty 100

## 2017-07-27 MED ORDER — LIDOCAINE 2% (20 MG/ML) 5 ML SYRINGE
INTRAMUSCULAR | Status: AC
  Filled 2017-07-27: qty 5

## 2017-07-27 MED ORDER — PROPOFOL 10 MG/ML IV BOLUS
INTRAVENOUS | Status: DC | PRN
  Administered 2017-07-27: 200 mg via INTRAVENOUS

## 2017-07-27 MED ORDER — FENTANYL CITRATE (PF) 100 MCG/2ML IJ SOLN
INTRAMUSCULAR | Status: AC
  Filled 2017-07-27: qty 2

## 2017-07-27 MED ORDER — MEPERIDINE HCL 25 MG/ML IJ SOLN
6.2500 mg | INTRAMUSCULAR | Status: DC | PRN
  Filled 2017-07-27: qty 1

## 2017-07-27 MED ORDER — DEXAMETHASONE SODIUM PHOSPHATE 10 MG/ML IJ SOLN
INTRAMUSCULAR | Status: AC
  Filled 2017-07-27: qty 1

## 2017-07-27 SURGICAL SUPPLY — 21 items
BAG DRAIN URO-CYSTO SKYTR STRL (DRAIN) ×3 IMPLANT
BAG DRN UROCATH (DRAIN) ×1
BASKET LASER NITINOL 1.9FR (BASKET) ×3 IMPLANT
BSKT STON RTRVL 120 1.9FR (BASKET) ×1
CATH INTERMIT  6FR 70CM (CATHETERS) ×2 IMPLANT
CLOTH BEACON ORANGE TIMEOUT ST (SAFETY) ×3 IMPLANT
FIBER LASER FLEXIVA 365 (UROLOGICAL SUPPLIES) IMPLANT
FIBER LASER TRAC TIP (UROLOGICAL SUPPLIES) IMPLANT
GOWN STRL REUS W/TWL XL LVL3 (GOWN DISPOSABLE) ×2 IMPLANT
INFUSOR MANOMETER BAG 3000ML (MISCELLANEOUS) ×3 IMPLANT
IV NS 1000ML (IV SOLUTION) ×3
IV NS 1000ML BAXH (IV SOLUTION) ×1 IMPLANT
IV NS IRRIG 3000ML ARTHROMATIC (IV SOLUTION) ×3 IMPLANT
IV SOD CHL 0.9% 1000ML (IV SOLUTION) ×2 IMPLANT
KIT RM TURNOVER CYSTO AR (KITS) ×3 IMPLANT
MANIFOLD NEPTUNE II (INSTRUMENTS) ×3 IMPLANT
NS IRRIG 500ML POUR BTL (IV SOLUTION) ×3 IMPLANT
PACK CYSTO (CUSTOM PROCEDURE TRAY) ×3 IMPLANT
TUBE CONNECTING 12'X1/4 (SUCTIONS)
TUBE CONNECTING 12X1/4 (SUCTIONS) IMPLANT
TUBE FEEDING 8FR 16IN STR KANG (MISCELLANEOUS) IMPLANT

## 2017-07-27 NOTE — Discharge Instructions (Addendum)
CYSTOSCOPY HOME CARE INSTRUCTIONS  Activity: Rest for the remainder of the day.  Do not drive or operate equipment today.  You may resume normal activities in one to two days as instructed by your physician.   Meals: Drink plenty of liquids and eat light foods such as gelatin or soup this evening.  You may return to a normal meal plan tomorrow.  Return to Work: You may return to work in one to two days or as instructed by your physician.  Special Instructions / Symptoms: Call your physician if any of these symptoms occur:   -persistent or heavy bleeding  -bleeding which continues after first few urination  -large blood clots that are difficult to pass  -urine stream diminishes or stops completely  -fever equal to or higher than 101 degrees Farenheit.  -cloudy urine with a strong, foul odor  -severe pain Do not take any non steroidal anti inflammatories ( advil, motrin, aleve, ibuprofen) until after 4:45 pm today.  Females should always wipe from front to back after elimination.  You may feel some burning pain when you urinate.  This should disappear with time.  Applying moist heat to the lower abdomen or a hot tub bath may help relieve the pain. \  Follow-Up / Date of Return Visit to Your Physician:   Call for an appointment to arrange follow-up.  Patient Signature:  ________________________________________________________  Nurse's Signature:  ________________________________________________________ He may notice some blood in the urine. This is normal as long as it is not severe and you can urinate. If you become concerned please call the office or go to the emergency room.  Call or go to the emergency Department for fever greater than 101.  Call or go to the emergency department for severe pain or severe nausea.    Post Anesthesia Home Care Instructions  Activity: Get plenty of rest for the remainder of the day. A responsible individual must stay with you for 24 hours  following the procedure.  For the next 24 hours, DO NOT: -Drive a car -Paediatric nurse -Drink alcoholic beverages -Take any medication unless instructed by your physician -Make any legal decisions or sign important papers.  Meals: Start with liquid foods such as gelatin or soup. Progress to regular foods as tolerated. Avoid greasy, spicy, heavy foods. If nausea and/or vomiting occur, drink only clear liquids until the nausea and/or vomiting subsides. Call your physician if vomiting continues.  Special Instructions/Symptoms: Your throat may feel dry or sore from the anesthesia or the breathing tube placed in your throat during surgery. If this causes discomfort, gargle with warm salt water. The discomfort should disappear within 24 hours.  If you had a scopolamine patch placed behind your ear for the management of post- operative nausea and/or vomiting:  1. The medication in the patch is effective for 72 hours, after which it should be removed.  Wrap patch in a tissue and discard in the trash. Wash hands thoroughly with soap and water. 2. You may remove the patch earlier than 72 hours if you experience unpleasant side effects which may include dry mouth, dizziness or visual disturbances. 3. Avoid touching the patch. Wash your hands with soap and water after contact with the patch.

## 2017-07-27 NOTE — Anesthesia Preprocedure Evaluation (Signed)
Anesthesia Evaluation  Patient identified by MRN, date of birth, ID band Patient awake    Reviewed: Allergy & Precautions, NPO status , Patient's Chart, lab work & pertinent test results  History of Anesthesia Complications (+) PONV, Family history of anesthesia reaction and history of anesthetic complications  Airway Mallampati: II  TM Distance: >3 FB Neck ROM: Full    Dental no notable dental hx. (+) Teeth Intact   Pulmonary neg pulmonary ROS,    Pulmonary exam normal breath sounds clear to auscultation       Cardiovascular hypertension, Pt. on medications Normal cardiovascular exam+ dysrhythmias  Rhythm:Regular Rate:Normal     Neuro/Psych  Headaches, Peripheral neuropathy  Neuromuscular disease negative psych ROS   GI/Hepatic Neg liver ROS, hiatal hernia, GERD  Medicated and Controlled,  Endo/Other  Obesity  Renal/GU Renal diseaseHx/o renal calculi Migrated right ureteral stent  negative genitourinary   Musculoskeletal   Abdominal (+) + obese,   Peds  Hematology   Anesthesia Other Findings   Reproductive/Obstetrics                             Anesthesia Physical Anesthesia Plan  ASA: II  Anesthesia Plan: General   Post-op Pain Management:    Induction: Intravenous  PONV Risk Score and Plan: 4 or greater and Ondansetron, Dexamethasone, Midazolam, Scopolamine patch - Pre-op and Propofol infusion  Airway Management Planned: LMA  Additional Equipment:   Intra-op Plan:   Post-operative Plan: Extubation in OR  Informed Consent: I have reviewed the patients History and Physical, chart, labs and discussed the procedure including the risks, benefits and alternatives for the proposed anesthesia with the patient or authorized representative who has indicated his/her understanding and acceptance.   Dental advisory given  Plan Discussed with: CRNA, Surgeon and  Anesthesiologist  Anesthesia Plan Comments:         Anesthesia Quick Evaluation

## 2017-07-27 NOTE — Transfer of Care (Signed)
Last Vitals:  Vitals:   07/27/17 1307 07/27/17 1308  BP:  (!) 177/97  Pulse: (!) 109 (!) 104  Resp: 20 18  Temp: 37.3 C   SpO2: 96% 96%    Last Pain:  Vitals:   07/27/17 1307  TempSrc:   PainSc: (P) Asleep      Patients Stated Pain Goal: 4 (07/27/17 1031)  Immediate Anesthesia Transfer of Care Note  Patient: Amanda Willis  Procedure(s) Performed: Procedure(s) (LRB): CYSTOSCOPY WITH URETEROSCOPY AND STENT EXTRACTION (Right)  Patient Location: PACU  Anesthesia Type: General  Level of Consciousness: awake, alert  and oriented  Airway & Oxygen Therapy: Patient Spontanous Breathing and Patient connected to nasal cannula oxygen  Post-op Assessment: Report given to PACU RN and Post -op Vital signs reviewed and stable  Post vital signs: Reviewed and stable  Complications: No apparent anesthesia complications

## 2017-07-27 NOTE — Op Note (Signed)
Operative Note  Preoperative diagnosis:  1. Retained and migrated right ureteral stent  Postoperative diagnosis: 1. Retained and migrated right ureteral stent  Procedure(s): 1. Cystoscopy with right ureteroscopy and right stent extraction  Surgeon: Link Snuffer, MD  Assistants:none  Anesthesia: Gen.  Complications: none immediate  EBL: none  Specimens: 1. none  Drains/Catheters: 1. none  Intraoperative findings: retained and migrated right ureteral stent which was removed in its entirety  Indication: the patient recently underwent ESWL and subsequently developed Steinstrasse and therefore underwent ureteroscopic stone extraction with ureteral stent placement. When she returned for stent removal in the clinic the stent was not seen. KUB revealed that the stent had migrated up.  Description of procedure:  The patient was identified and consent was obtained. She was taken back to the operating room and placed in the supine position. She was placed under general anesthesia. She was converted to dorsal lithotomy. Timeout was performed she was prepped and draped in standard sterile fashion. A 21 French rigid cystoscope was advanced into the urethra and into the bladder. Cystoscopy revealed inflammation around the trigone consistent with her prior surgery. A wire was advanced up the right ureter under fluoroscopic guidance. A semirigid ureteroscope was advanced alongside the wire and the stent was visualized. A grasper was used to grasp the stent and the stent was removed. Fluoroscopy confirmed no stone fragments. There were no stone fragments seen as well. The bladder was drained and the wire was removed and this concluded the operation. Patient tolerated procedure well and was stable postoperatively.  Plan:the patient will follow-up with her primary urologist.

## 2017-07-27 NOTE — Anesthesia Postprocedure Evaluation (Signed)
Anesthesia Post Note  Patient: Amanda Willis  Procedure(s) Performed: Procedure(s) (LRB): CYSTOSCOPY WITH URETEROSCOPY AND STENT EXTRACTION (Right)     Patient location during evaluation: PACU Anesthesia Type: General Level of consciousness: awake and alert and oriented Pain management: pain level controlled Vital Signs Assessment: post-procedure vital signs reviewed and stable Respiratory status: spontaneous breathing, nonlabored ventilation and respiratory function stable Cardiovascular status: blood pressure returned to baseline and stable Postop Assessment: no signs of nausea or vomiting Anesthetic complications: no    Last Vitals:  Vitals:   07/27/17 1345 07/27/17 1428  BP: (!) 154/69 (!) 155/90  Pulse: 94 98  Resp: 17 18  Temp:  37 C  SpO2: 100% 98%    Last Pain:  Vitals:   07/27/17 1430  TempSrc:   PainSc: 3                  Laney Bagshaw A.

## 2017-07-27 NOTE — Anesthesia Procedure Notes (Signed)
Procedure Name: Intubation Date/Time: 07/27/2017 12:27 PM Performed by: Josephine Igo Pre-anesthesia Checklist: Patient identified, Emergency Drugs available, Suction available and Patient being monitored Patient Re-evaluated:Patient Re-evaluated prior to induction Oxygen Delivery Method: Circle system utilized Preoxygenation: Pre-oxygenation with 100% oxygen Induction Type: IV induction, Rapid sequence and Cricoid Pressure applied Ventilation: Mask ventilation without difficulty Laryngoscope Size: Mac and 4 Grade View: Grade I Tube type: Oral Tube size: 7.5 mm Number of attempts: 1 Airway Equipment and Method: Stylet and Oral airway Placement Confirmation: ETT inserted through vocal cords under direct vision,  positive ETCO2 and breath sounds checked- equal and bilateral Secured at: 21 cm Tube secured with: Tape Dental Injury: Teeth and Oropharynx as per pre-operative assessment  Comments: By Charleston Ropes, CRNA

## 2017-07-27 NOTE — H&P (Signed)
Urology Admission H&P  Chief Complaint: migrated right ureteral stent  History of Present Illness: the patient recently underwent a right ureteroscopy with laser lithotripsy and ureteral stent placement.  She returned for ureteral stent removal, but it was unable to be removed in  The office.  KUB confirmed migration up the ureter.  She presents to have this removed.  Past Medical History:  Diagnosis Date  . Family history of adverse reaction to anesthesia    "Dad was very hard to wake up"  . GERD (gastroesophageal reflux disease)   . Hiatal hernia   . History of benign kidney tumor excluding renal pelvis    s/p  partial right nerphrectomy for mass--  benign 1986  . History of kidney stones   . Hyperlipidemia   . Hypertension   . LAFB (left anterior fascicular block)   . Migraine   . 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 Nephrolithiasis   . 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 PONV (postoperative nausea and vomiting)    "hard to wake up"  . Urgency of urination   . Wears glasses    Past Surgical History:  Procedure Laterality Date  . ANTERIOR AND POSTERIOR REPAIR N/A 03/15/2013   Procedure: POSTERIOR REPAIR;  Surgeon: Cyril Mourning, MD;  Location: [facility];  Service: Gynecology;  Laterality: N/A;  . ANTERIOR CERVICAL DECOMP/DISCECTOMY FUSION  05/13/2008   C5 -- C7  . CYSTOSCOPY W/ URETERAL STENT PLACEMENT Left 08/16/2016   Procedure: CYSTOSCOPY WITH RETROGRADE PYELOGRAM/LEFT URETERAL STENT PLACEMENT;  Surgeon: Nickie Retort, MD;  Location: [facility];  Service: Urology;  Laterality: Left;  . CYSTOSCOPY WITH RETROGRADE PYELOGRAM, URETEROSCOPY AND STENT PLACEMENT Left 08/30/2016   Procedure: CYSTOSCOPY LEFT STENT REMOVAL, LEFT RETROGRADE AND URETEROSCOPY;  Surgeon: Kathie Rhodes, MD;  Location: [facility];  Service: Urology;  Laterality: Left;  . CYSTOSCOPY/RETROGRADE/URETEROSCOPY/STONE EXTRACTION  WITH BASKET Right 07/17/2017   Procedure: CYSTOSCOPY/RIGHT RETROGRADE/RIGHT URETEROSCOPY/STONE EXTRACTION WITH BASKET/LASER LITHOTRIPSY;  Surgeon: Cleon Gustin, MD;  Location: [facility];  Service: Urology;  Laterality: Right;  . EXTERNAL HEMORRHOIDECOTMY AND  RECTOCELE REPAIR  04/03/2007  . EXTRACORPOREAL SHOCK WAVE LITHOTRIPSY Right 07/14/2017   Procedure: RIGHT EXTRACORPOREAL SHOCK WAVE LITHOTRIPSY (ESWL);  Surgeon: Cleon Gustin, MD;  Location: [facility];  Service: Urology;  Laterality: Right;  . PARTIAL NEPHRECTOMY Right 1986  . POSTERIOR FUSION CERVICAL SPINE  10/24/2009    lateral  w/ vertex rod  C5 -- C7  . REDUCTION MAMMAPLASTY Bilateral 1992  . STONE EXTRACTION WITH BASKET Left 08/30/2016   Procedure: STONE EXTRACTION WITH BASKET;  Surgeon: Kathie Rhodes, MD;  Location: [facility];  Service: Urology;  Laterality: Left;  . TONSILLECTOMY AND ADENOIDECTOMY  age 56  . TOTAL ABDOMINAL HYSTERECTOMY W/ BILATERAL SALPINGOOPHORECTOMY  1984    Home Medications:  Prescriptions Prior to Admission  Medication Sig Dispense Refill Last Dose  . aspirin EC 81 MG tablet Take 81 mg by mouth daily.   Past Month at Unknown time  . esomeprazole (NEXIUM) 40 MG capsule Take 40 mg by mouth daily before breakfast.   07/27/2017 at 0530  . hydrochlorothiazide (HYDRODIURIL) 50 MG tablet Take 50 mg by mouth every morning.    07/26/2017 at Unknown time  . lisinopril (PRINIVIL,ZESTRIL) 10 MG tablet Take 10 mg by mouth daily.   07/26/2017 at Unknown time  . ondansetron (ZOFRAN) 4 MG tablet TAKE 1 TABLET  EVER 6 HOURS AS NEEDED FOR NAUSEA AND VOMITING  0 07/27/2017 at 0530  . oxyCODONE-acetaminophen (PERCOCET) 10-325 MG tablet Take 1 tablet by mouth every 4 (four) hours as needed for pain. 30 tablet 0 07/27/2017 at 0530  . phenazopyridine (PYRIDIUM) 200 MG tablet Take 1 tablet (200 mg total) by mouth 3 (three) times daily as needed for pain. 16 tablet 0 Past Week at Unknown time  . tamsulosin (FLOMAX)  0.4 MG CAPS capsule Take 1 capsule (0.4 mg total) by mouth daily. 30 capsule 0 07/26/2017 at Unknown time  . venlafaxine (EFFEXOR) 37.5 MG tablet Take 37.5 mg by mouth at bedtime.    07/26/2017 at Unknown time  . docusate sodium 100 MG CAPS Take 100 mg by mouth daily. (Patient taking differently: Take 100 mg by mouth daily as needed (constipation). ) 10 capsule 0 Unknown at Unknown time  . fluticasone (FLONASE) 50 MCG/ACT nasal spray Place 2 sprays into both nostrils daily as needed for rhinitis or allergies.    More than a month at Unknown time  . hyoscyamine (LEVSIN SL) 0.125 MG SL tablet Place 1 tablet (0.125 mg total) under the tongue every 4 (four) hours as needed (bladder spasms). 30 tablet 0 Unknown at Unknown time  . LINZESS 145 MCG CAPS capsule Take 145 mcg by mouth daily before breakfast.    07/25/2017  . mirabegron ER (MYRBETRIQ) 25 MG TB24 tablet Take 1 tablet (25 mg total) by mouth daily. 30 tablet 0 Unknown at Unknown time   Allergies:  Allergies  Allergen Reactions  . Morphine And Related Shortness Of Breath, Itching and Rash  . Reglan [Metoclopramide] Anxiety    History reviewed. No pertinent family history. Social History:  reports that she has never smoked. She has never used smokeless tobacco. She reports that she does not drink alcohol or use drugs.  Review of Systems  All other systems reviewed and are negative.   Physical Exam:  Vital signs in last 24 hours: Temp:  [97.9 F (36.6 C)] 97.9 F (36.6 C) (09/12 0943) Pulse Rate:  [95] 95 (09/12 0943) Resp:  [18] 18 (09/12 0943) BP: (154)/(85) 154/85 (09/12 0943) SpO2:  [100 %] 100 % (09/12 0943) Weight:  [100.5 kg (221 lb 8 oz)] 100.5 kg (221 lb 8 oz) (09/12 1610) Physical Exam  Constitutional: She is oriented to person, place, and time. She appears well-developed and well-nourished.  HENT:  Head: Normocephalic and atraumatic.  Eyes: EOM are normal.  Respiratory: Effort normal.  GI: Soft. Bowel sounds are normal.   Musculoskeletal: Normal range of motion.  Neurological: She is alert and oriented to person, place, and time.    Laboratory Data:  Results for orders placed or performed during the hospital encounter of 07/27/17 (from the past 24 hour(s))  I-STAT 4, (NA,K, GLUC, HGB,HCT)     Status: Abnormal   Collection Time: 07/27/17 10:04 AM  Result Value Ref Range   Sodium 140 135 - 145 mmol/L   Potassium 3.4 (L) 3.5 - 5.1 mmol/L   Glucose, Bld 116 (H) 65 - 99 mg/dL   HCT 46.0 36.0 - 46.0 %   Hemoglobin 15.6 (H) 12.0 - 15.0 g/dL   No results found for this or any previous visit (from the past 240 hour(s)). Creatinine: No results for input(s): CREATININE in the last 168 hours.   Impression/Assessment:  Migrated right ureteral stent/retained ureteral stent  Plan:  Proceed with cystoscopy, right ureteral stent removal/retrieval.she understands the potential risks of bleeding, infection, or injury  to other structures.  Marton Redwood, III 07/27/2017, 11:20 AM

## 2017-07-28 ENCOUNTER — Encounter (HOSPITAL_BASED_OUTPATIENT_CLINIC_OR_DEPARTMENT_OTHER): Payer: Self-pay | Admitting: Urology

## 2019-11-06 ENCOUNTER — Other Ambulatory Visit: Payer: Self-pay

## 2019-11-06 ENCOUNTER — Other Ambulatory Visit: Payer: Self-pay | Admitting: Internal Medicine

## 2019-11-06 ENCOUNTER — Ambulatory Visit
Admission: RE | Admit: 2019-11-06 | Discharge: 2019-11-06 | Disposition: A | Discharge status: Home / Self Care | Payer: Medicare Other | Source: Ambulatory Visit | Attending: Internal Medicine | Admitting: Internal Medicine

## 2019-11-06 DIAGNOSIS — R1032 Left lower quadrant pain: Secondary | ICD-10-CM

## 2019-11-06 MED ORDER — IOPAMIDOL (ISOVUE-300) INJECTION 61%
100.0000 mL | Freq: Once | INTRAVENOUS | Status: AC | PRN
  Administered 2019-11-06: 100 mL via INTRAVENOUS

## 2020-09-27 IMAGING — CT CT ABD-PELV W/ CM
1 of 3 series · 14 of 32 positions shown, 19 images · IV contrast (iopamidol)
Comparison: None.

CLINICAL DATA: Left lower quadrant pain

EXAM:
CT ABDOMEN AND PELVIS WITH CONTRAST
TECHNIQUE: Multidetector CT imaging of the abdomen and pelvis was performed
using the standard protocol following bolus administration of
intravenous contrast.
CONTRAST:  100mL [id number] IOPAMIDOL ([id number]-F88) INJECTION 61%

[Series 2: abd/pelvis w/cm · axial · 0.90mm/px · z∈[-547,-82]mm · 14 of 107 slices shown, 19 images]
[im 7/107  soft-tissue]
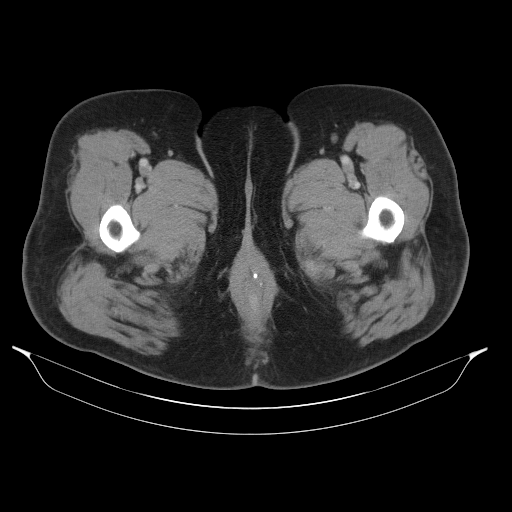
[im 7/107  bone]
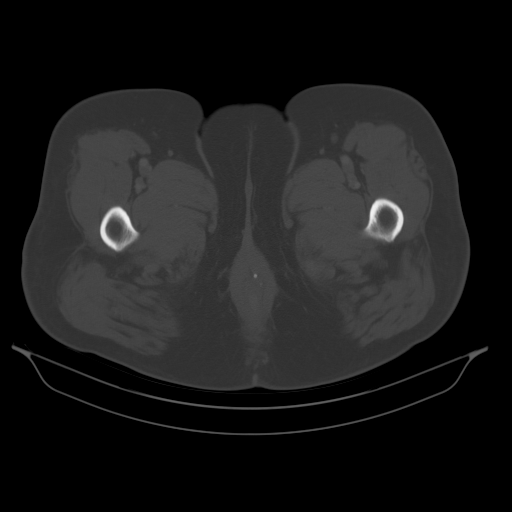
[im 14/107  soft-tissue]
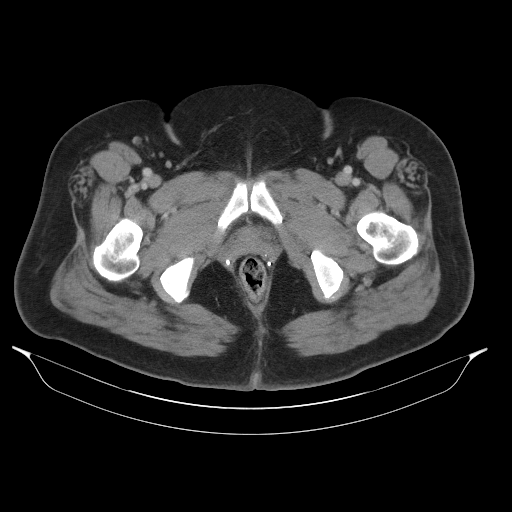
[im 20/107  soft-tissue]
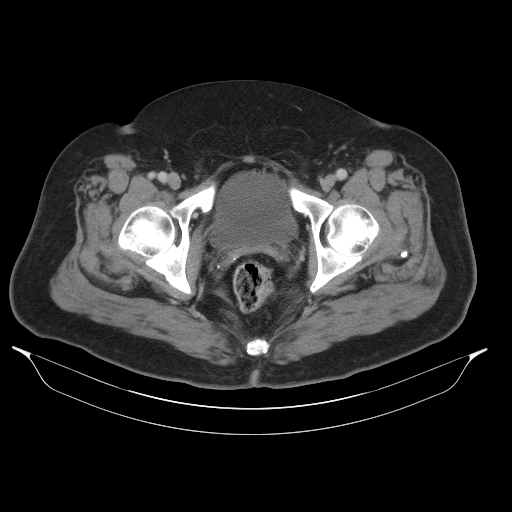
[im 34/107  soft-tissue]
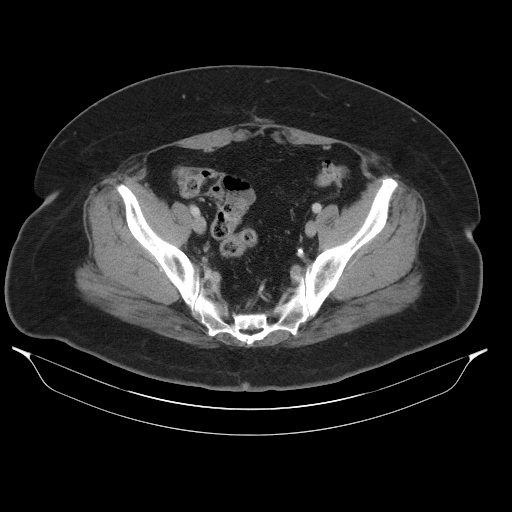
[im 40/107  soft-tissue]
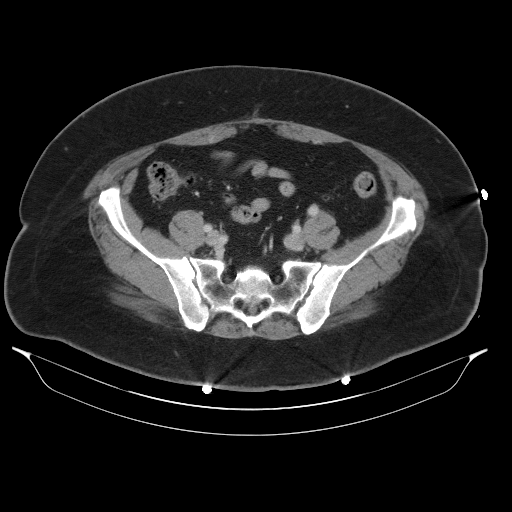
[im 47/107  soft-tissue]
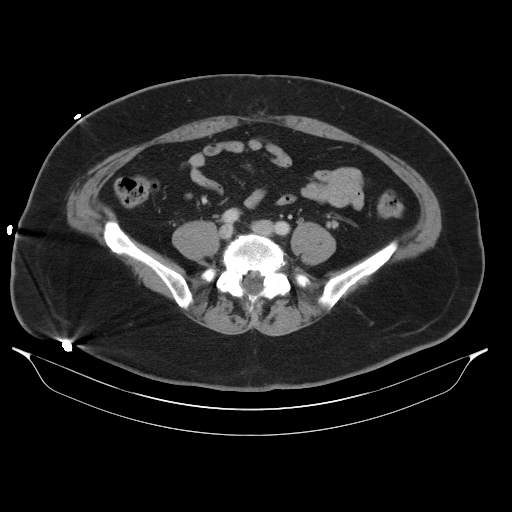
[im 54/107  soft-tissue]
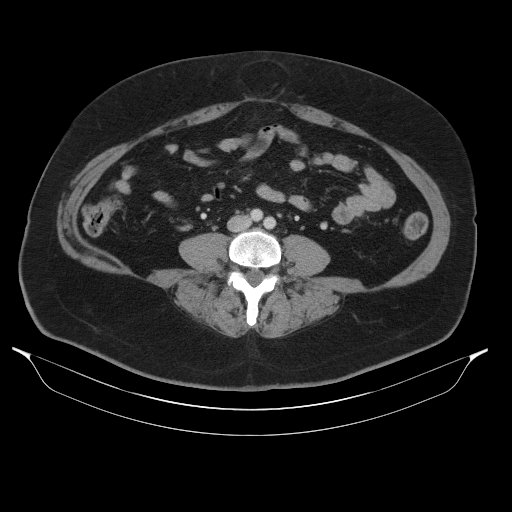
[im 60/107  soft-tissue]
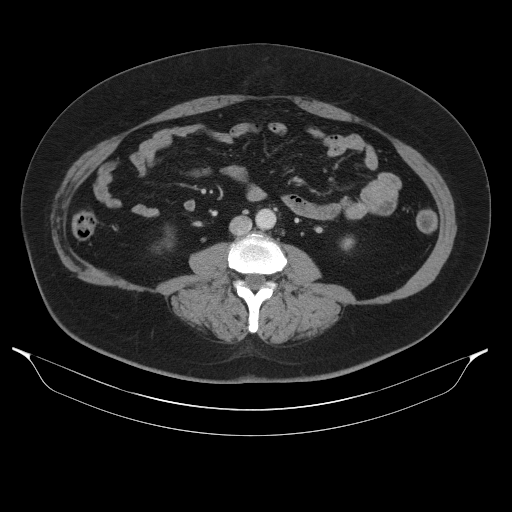
[im 67/107  soft-tissue]
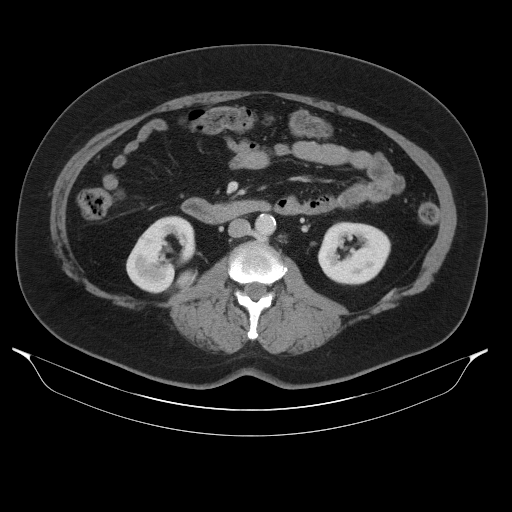
[im 67/107  bone]
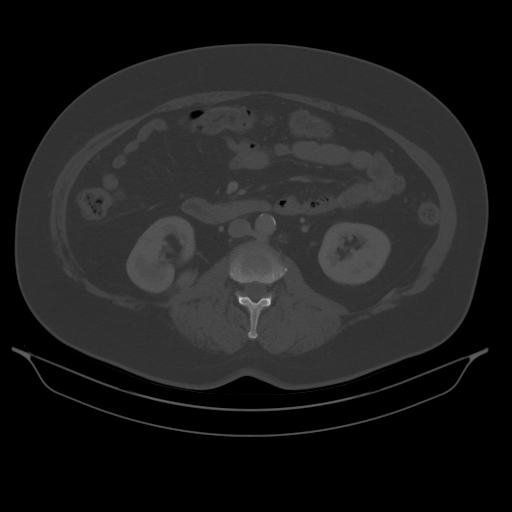
[im 73/107  soft-tissue]
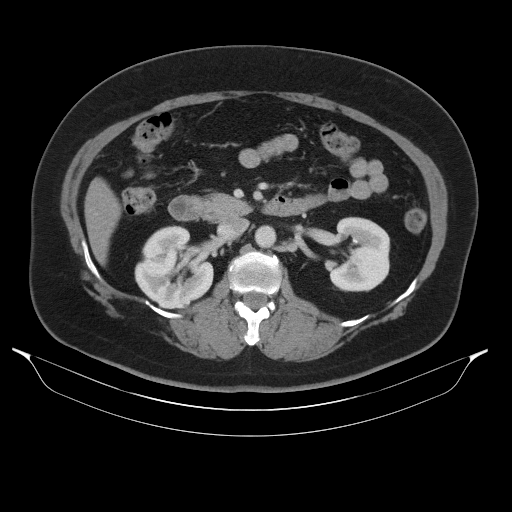
[im 80/107  lung]
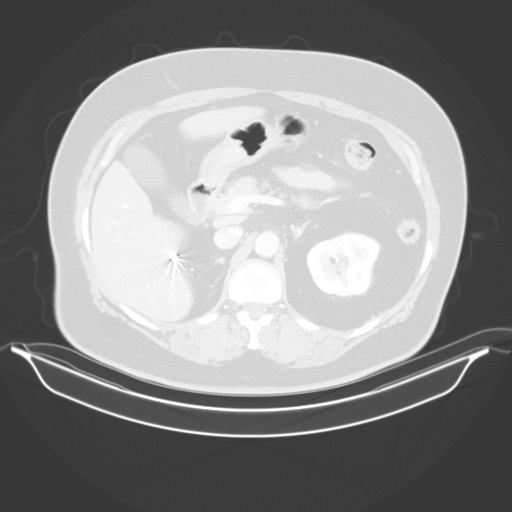
[im 87/107  soft-tissue]
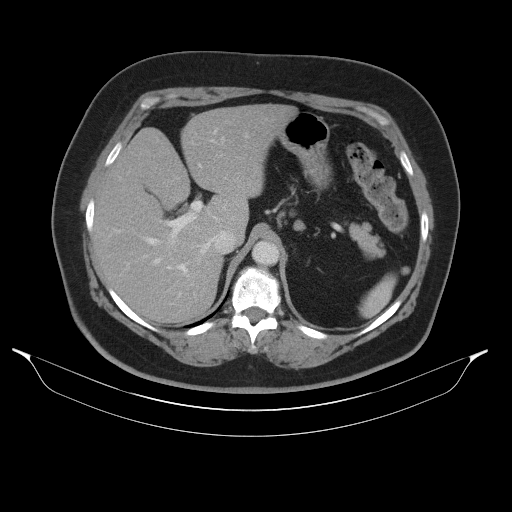
[im 87/107  lung]
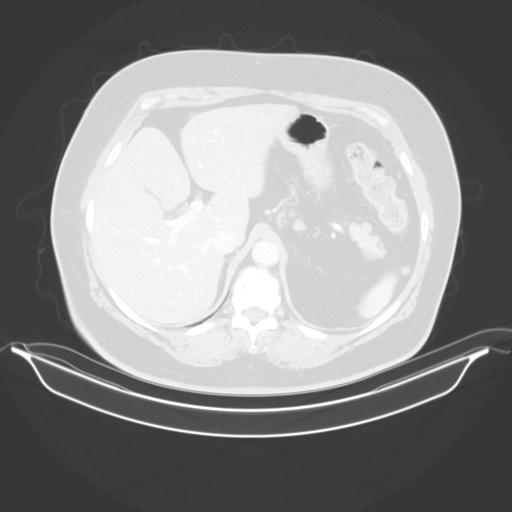
[im 93/107  soft-tissue]
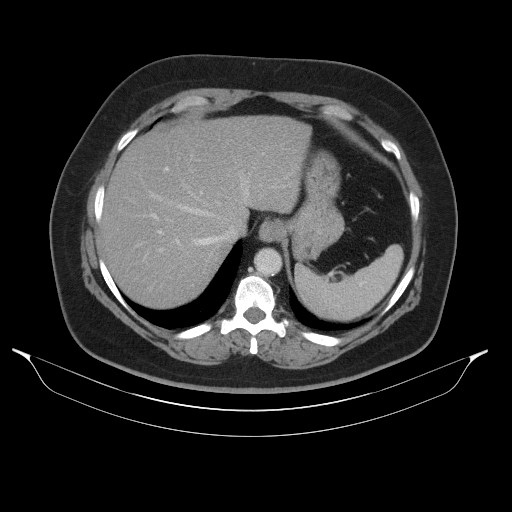
[im 93/107  lung]
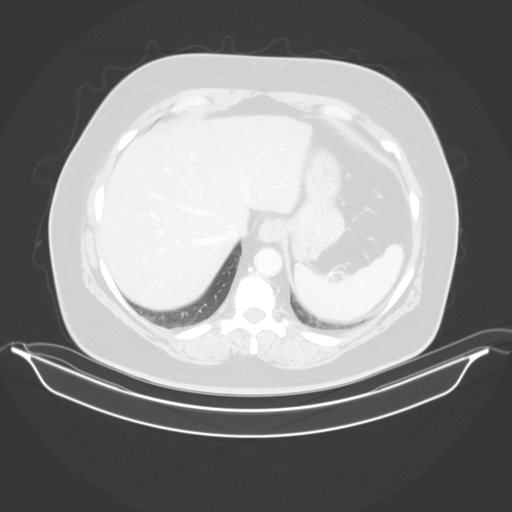
[im 100/107  soft-tissue]
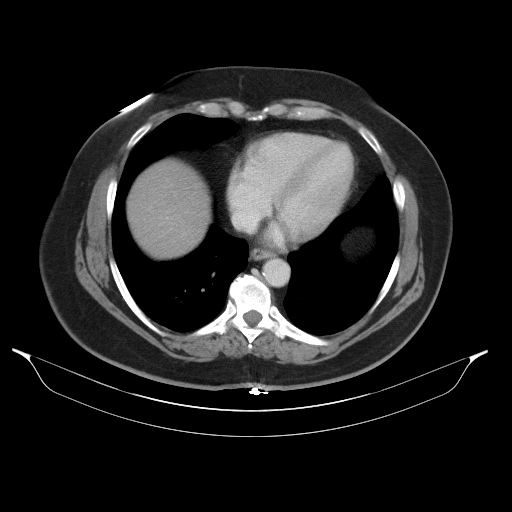
[im 100/107  lung]
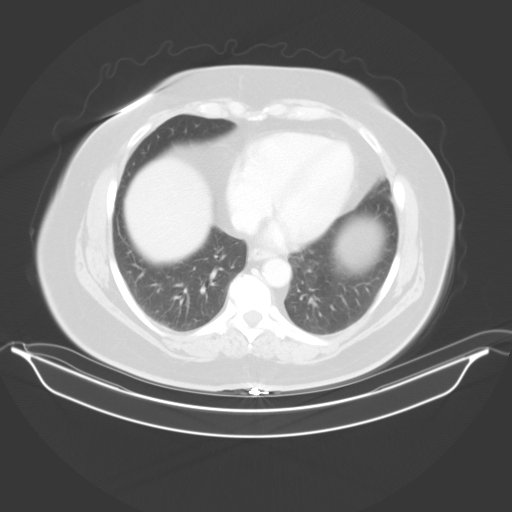

[14 of 32 positions shown; findings below may reference images not displayed]

FINDINGS: Lower chest: Lung bases are clear. No effusions. Heart is normal
size.

Hepatobiliary: No focal hepatic abnormality. Gallbladder
unremarkable.

Pancreas: No focal abnormality or ductal dilatation.

Spleen: No focal abnormality.  Normal size.

Adrenals/Urinary Tract: Scarring in the upper pole of the right
kidney. No renal or ureteral stones. No hydronephrosis. Urinary
bladder unremarkable.

Stomach/Bowel: Stomach, large and small bowel grossly unremarkable.

Vascular/Lymphatic: Aortic atherosclerosis. No enlarged abdominal or
pelvic lymph nodes.

Reproductive: Prior hysterectomy.  No adnexal masses.

Other: No free fluid or free air. Umbilical hernia containing fat,
stable.

Musculoskeletal: No acute bony abnormality.
IMPRESSION: No acute findings in the abdomen or pelvis.

Aortic atherosclerosis.

## 2020-12-06 ENCOUNTER — Other Ambulatory Visit: Payer: Self-pay | Admitting: Internal Medicine

## 2020-12-06 DIAGNOSIS — Z13820 Encounter for screening for osteoporosis: Secondary | ICD-10-CM

## 2020-12-06 DIAGNOSIS — G35 Multiple sclerosis: Secondary | ICD-10-CM

## 2023-12-30 DIAGNOSIS — E669 Obesity, unspecified: Secondary | ICD-10-CM | POA: Diagnosis not present

## 2023-12-30 DIAGNOSIS — E1169 Type 2 diabetes mellitus with other specified complication: Secondary | ICD-10-CM | POA: Diagnosis not present

## 2023-12-30 DIAGNOSIS — Z0000 Encounter for general adult medical examination without abnormal findings: Secondary | ICD-10-CM | POA: Diagnosis not present

## 2023-12-30 DIAGNOSIS — I10 Essential (primary) hypertension: Secondary | ICD-10-CM | POA: Diagnosis not present

## 2023-12-30 DIAGNOSIS — K5900 Constipation, unspecified: Secondary | ICD-10-CM | POA: Diagnosis not present

## 2023-12-30 DIAGNOSIS — L509 Urticaria, unspecified: Secondary | ICD-10-CM | POA: Diagnosis not present

## 2023-12-30 DIAGNOSIS — F330 Major depressive disorder, recurrent, mild: Secondary | ICD-10-CM | POA: Diagnosis not present

## 2023-12-30 DIAGNOSIS — Z79890 Hormone replacement therapy (postmenopausal): Secondary | ICD-10-CM | POA: Diagnosis not present

## 2023-12-30 DIAGNOSIS — Z1331 Encounter for screening for depression: Secondary | ICD-10-CM | POA: Diagnosis not present

## 2023-12-30 DIAGNOSIS — G43909 Migraine, unspecified, not intractable, without status migrainosus: Secondary | ICD-10-CM | POA: Diagnosis not present

## 2023-12-30 DIAGNOSIS — E785 Hyperlipidemia, unspecified: Secondary | ICD-10-CM | POA: Diagnosis not present

## 2023-12-30 DIAGNOSIS — Z1339 Encounter for screening examination for other mental health and behavioral disorders: Secondary | ICD-10-CM | POA: Diagnosis not present

## 2023-12-30 DIAGNOSIS — Z8719 Personal history of other diseases of the digestive system: Secondary | ICD-10-CM | POA: Diagnosis not present

## 2023-12-30 DIAGNOSIS — G35 Multiple sclerosis: Secondary | ICD-10-CM | POA: Diagnosis not present

## 2024-01-31 DIAGNOSIS — K5730 Diverticulosis of large intestine without perforation or abscess without bleeding: Secondary | ICD-10-CM | POA: Diagnosis not present

## 2024-01-31 DIAGNOSIS — R109 Unspecified abdominal pain: Secondary | ICD-10-CM | POA: Diagnosis not present

## 2024-01-31 DIAGNOSIS — G8929 Other chronic pain: Secondary | ICD-10-CM | POA: Diagnosis not present

## 2024-01-31 DIAGNOSIS — R059 Cough, unspecified: Secondary | ICD-10-CM | POA: Diagnosis not present

## 2024-01-31 DIAGNOSIS — R0789 Other chest pain: Secondary | ICD-10-CM | POA: Diagnosis not present

## 2024-01-31 DIAGNOSIS — R1011 Right upper quadrant pain: Secondary | ICD-10-CM | POA: Diagnosis not present

## 2024-01-31 DIAGNOSIS — R112 Nausea with vomiting, unspecified: Secondary | ICD-10-CM | POA: Diagnosis not present

## 2024-01-31 DIAGNOSIS — K760 Fatty (change of) liver, not elsewhere classified: Secondary | ICD-10-CM | POA: Diagnosis not present

## 2024-01-31 DIAGNOSIS — D72829 Elevated white blood cell count, unspecified: Secondary | ICD-10-CM | POA: Diagnosis not present

## 2024-06-27 DIAGNOSIS — E785 Hyperlipidemia, unspecified: Secondary | ICD-10-CM | POA: Diagnosis not present

## 2024-06-27 DIAGNOSIS — Z79890 Hormone replacement therapy (postmenopausal): Secondary | ICD-10-CM | POA: Diagnosis not present

## 2024-06-27 DIAGNOSIS — E1169 Type 2 diabetes mellitus with other specified complication: Secondary | ICD-10-CM | POA: Diagnosis not present

## 2024-06-27 DIAGNOSIS — K219 Gastro-esophageal reflux disease without esophagitis: Secondary | ICD-10-CM | POA: Diagnosis not present

## 2024-06-27 DIAGNOSIS — I10 Essential (primary) hypertension: Secondary | ICD-10-CM | POA: Diagnosis not present

## 2024-07-02 DIAGNOSIS — E669 Obesity, unspecified: Secondary | ICD-10-CM | POA: Diagnosis not present

## 2024-07-02 DIAGNOSIS — E1169 Type 2 diabetes mellitus with other specified complication: Secondary | ICD-10-CM | POA: Diagnosis not present

## 2024-07-02 DIAGNOSIS — E785 Hyperlipidemia, unspecified: Secondary | ICD-10-CM | POA: Diagnosis not present

## 2024-07-02 DIAGNOSIS — Z8719 Personal history of other diseases of the digestive system: Secondary | ICD-10-CM | POA: Diagnosis not present

## 2024-07-02 DIAGNOSIS — K5900 Constipation, unspecified: Secondary | ICD-10-CM | POA: Diagnosis not present

## 2024-07-02 DIAGNOSIS — F330 Major depressive disorder, recurrent, mild: Secondary | ICD-10-CM | POA: Diagnosis not present

## 2024-07-02 DIAGNOSIS — G35 Multiple sclerosis: Secondary | ICD-10-CM | POA: Diagnosis not present

## 2024-07-02 DIAGNOSIS — G43909 Migraine, unspecified, not intractable, without status migrainosus: Secondary | ICD-10-CM | POA: Diagnosis not present

## 2024-07-02 DIAGNOSIS — I10 Essential (primary) hypertension: Secondary | ICD-10-CM | POA: Diagnosis not present

## 2024-07-02 DIAGNOSIS — Z6831 Body mass index (BMI) 31.0-31.9, adult: Secondary | ICD-10-CM | POA: Diagnosis not present

## 2024-11-02 NOTE — Progress Notes (Signed)
 Amanda Willis                                          MRN: [id number]   11/02/2024   The [facility] Quality Team Specialist reviewed this patient medical record for the purposes of chart review for care gap closure. The following were reviewed: chart review for care gap closure-kidney health evaluation for diabetes:eGFR  and uACR.    [facility] Quality Team
